# Patient Record
Sex: Male | Born: 1975
Health system: Southern US, Community
[De-identification: ages and names within clinical notes are randomized; demographics above are authoritative.]

## PROBLEM LIST (undated history)

## (undated) DIAGNOSIS — G40909 Epilepsy, unspecified, not intractable, without status epilepticus: Secondary | ICD-10-CM

## (undated) DIAGNOSIS — F419 Anxiety disorder, unspecified: Secondary | ICD-10-CM

## (undated) DIAGNOSIS — R4189 Other symptoms and signs involving cognitive functions and awareness: Secondary | ICD-10-CM

## (undated) DIAGNOSIS — F22 Delusional disorders: Secondary | ICD-10-CM

## (undated) DIAGNOSIS — K219 Gastro-esophageal reflux disease without esophagitis: Secondary | ICD-10-CM

## (undated) DIAGNOSIS — I10 Essential (primary) hypertension: Secondary | ICD-10-CM

## (undated) DIAGNOSIS — N3281 Overactive bladder: Secondary | ICD-10-CM

## (undated) DIAGNOSIS — F209 Schizophrenia, unspecified: Secondary | ICD-10-CM

## (undated) DIAGNOSIS — R739 Hyperglycemia, unspecified: Secondary | ICD-10-CM

---

## 2006-09-26 ENCOUNTER — Emergency Department (HOSPITAL_COMMUNITY): Admission: EM | Admit: 2006-09-26 | Discharge: 2006-09-26 | Payer: Self-pay | Admitting: Emergency Medicine

## 2010-11-05 LAB — RAPID URINE DRUG SCREEN, HOSP PERFORMED
Amphetamines: NOT DETECTED
Barbiturates: NOT DETECTED
Benzodiazepines: NOT DETECTED
Cocaine: NOT DETECTED

## 2010-11-05 LAB — POCT URINALYSIS DIP (DEVICE)
Glucose, UA: NEGATIVE
Nitrite: POSITIVE — AB
Protein, ur: >=300 — AB
Specific Gravity, Urine: >=1.03
Urobilinogen, UA: 2 — ABNORMAL HIGH
pH: 6

## 2010-11-05 LAB — VALPROIC ACID LEVEL: Valproic Acid Lvl: 73.1

## 2010-11-05 LAB — CBC
HCT: 36.5 — ABNORMAL LOW
Hemoglobin: 12.3 — ABNORMAL LOW
MCHC: 33.6
MCV: 93.9
RBC: 3.89 — ABNORMAL LOW

## 2010-11-05 LAB — URINE CULTURE

## 2010-11-05 LAB — DIFFERENTIAL
Basophils Absolute: 0
Eosinophils Absolute: 0.2
Lymphocytes Relative: 47 — ABNORMAL HIGH
Lymphs Abs: 4.3 — ABNORMAL HIGH
Neutrophils Relative %: 43

## 2010-11-05 LAB — URINALYSIS, ROUTINE W REFLEX MICROSCOPIC
Bilirubin Urine: NEGATIVE
Nitrite: NEGATIVE
Protein, ur: 30 — AB
Specific Gravity, Urine: 1.023
Urobilinogen, UA: 0.2

## 2010-11-05 LAB — COMPREHENSIVE METABOLIC PANEL
BUN: 15
CO2: 21
Calcium: 8.6
Creatinine, Ser: 1.4
GFR calc non Af Amer: 60 — ABNORMAL LOW
Glucose, Bld: 153 — ABNORMAL HIGH

## 2010-11-05 LAB — MAGNESIUM: Magnesium: 2.3

## 2010-11-05 LAB — URINE MICROSCOPIC-ADD ON

## 2011-10-30 ENCOUNTER — Emergency Department (INDEPENDENT_AMBULATORY_CARE_PROVIDER_SITE_OTHER)
Admission: EM | Admit: 2011-10-30 | Discharge: 2011-10-30 | Disposition: A | Discharge status: Home / Self Care | Payer: Medicare Other | Source: Home / Self Care | Attending: Emergency Medicine | Admitting: Emergency Medicine

## 2011-10-30 ENCOUNTER — Encounter (HOSPITAL_COMMUNITY): Payer: Self-pay | Admitting: Emergency Medicine

## 2011-10-30 DIAGNOSIS — S01511A Laceration without foreign body of lip, initial encounter: Secondary | ICD-10-CM

## 2011-10-30 DIAGNOSIS — Z23 Encounter for immunization: Secondary | ICD-10-CM

## 2011-10-30 DIAGNOSIS — S01501A Unspecified open wound of lip, initial encounter: Secondary | ICD-10-CM

## 2011-10-30 HISTORY — DX: Delusional disorders: F22

## 2011-10-30 MED ORDER — TETANUS-DIPHTH-ACELL PERTUSSIS 5-2.5-18.5 LF-MCG/0.5 IM SUSP
INTRAMUSCULAR | Status: AC
  Filled 2011-10-30: qty 0.5

## 2011-10-30 MED ORDER — MUPIROCIN 2 % EX OINT: TOPICAL_OINTMENT | Freq: Three times a day (TID) | CUTANEOUS | Status: DC

## 2011-10-30 MED ORDER — TETANUS-DIPHTH-ACELL PERTUSSIS 5-2.5-18.5 LF-MCG/0.5 IM SUSP
0.5000 mL | Freq: Once | INTRAMUSCULAR | Status: AC
  Administered 2011-10-30: 0.5 mL via INTRAMUSCULAR
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
## 2012-07-24 ENCOUNTER — Emergency Department (HOSPITAL_COMMUNITY): Payer: Medicare Other

## 2012-07-24 ENCOUNTER — Emergency Department (HOSPITAL_COMMUNITY)
Admission: EM | Admit: 2012-07-24 | Discharge: 2012-07-25 | Disposition: A | Discharge status: Home / Self Care | Payer: Medicare Other | Attending: Emergency Medicine | Admitting: Emergency Medicine

## 2012-07-24 ENCOUNTER — Encounter (HOSPITAL_COMMUNITY): Payer: Self-pay | Admitting: Emergency Medicine

## 2012-07-24 DIAGNOSIS — R5381 Other malaise: Secondary | ICD-10-CM | POA: Insufficient documentation

## 2012-07-24 DIAGNOSIS — Z79899 Other long term (current) drug therapy: Secondary | ICD-10-CM | POA: Insufficient documentation

## 2012-07-24 DIAGNOSIS — R4182 Altered mental status, unspecified: Secondary | ICD-10-CM

## 2012-07-24 DIAGNOSIS — R112 Nausea with vomiting, unspecified: Secondary | ICD-10-CM | POA: Insufficient documentation

## 2012-07-24 DIAGNOSIS — R42 Dizziness and giddiness: Secondary | ICD-10-CM | POA: Insufficient documentation

## 2012-07-24 DIAGNOSIS — F22 Delusional disorders: Secondary | ICD-10-CM | POA: Insufficient documentation

## 2012-07-24 DIAGNOSIS — R443 Hallucinations, unspecified: Secondary | ICD-10-CM | POA: Insufficient documentation

## 2012-07-24 LAB — CBC WITH DIFFERENTIAL/PLATELET
Basophils Absolute: 0 10*3/uL (ref 0.0–0.1)
Eosinophils Relative: 1 % (ref 0–5)
HCT: 39.3 % (ref 39.0–52.0)
Hemoglobin: 13.9 g/dL (ref 13.0–17.0)
Lymphocytes Relative: 24 % (ref 12–46)
Lymphs Abs: 1.8 10*3/uL (ref 0.7–4.0)
MCV: 91.4 fL (ref 78.0–100.0)
Monocytes Absolute: 0.6 10*3/uL (ref 0.1–1.0)
Monocytes Relative: 8 % (ref 3–12)
Neutro Abs: 5 10*3/uL (ref 1.7–7.7)
RBC: 4.3 MIL/uL (ref 4.22–5.81)
WBC: 7.4 10*3/uL (ref 4.0–10.5)

## 2012-07-24 LAB — POCT I-STAT, CHEM 8
Chloride: 102 mEq/L (ref 96–112)
Creatinine, Ser: 1.1 mg/dL (ref 0.50–1.35)
HCT: 43 % (ref 39.0–52.0)
Hemoglobin: 14.6 g/dL (ref 13.0–17.0)
Potassium: 4 mEq/L (ref 3.5–5.1)
Sodium: 143 mEq/L (ref 135–145)

## 2012-07-24 LAB — URINALYSIS, ROUTINE W REFLEX MICROSCOPIC
Bilirubin Urine: NEGATIVE
Glucose, UA: NEGATIVE mg/dL
Ketones, ur: 15 mg/dL — AB
Nitrite: NEGATIVE
Specific Gravity, Urine: 1.007 (ref 1.005–1.030)
pH: 6.5 (ref 5.0–8.0)

## 2012-07-24 LAB — ACETAMINOPHEN LEVEL: Acetaminophen (Tylenol), Serum: <15 ug/mL (ref 10–30)

## 2012-07-24 LAB — RAPID URINE DRUG SCREEN, HOSP PERFORMED
Amphetamines: NOT DETECTED
Barbiturates: NOT DETECTED
Benzodiazepines: NOT DETECTED
Cocaine: NOT DETECTED

## 2012-07-24 LAB — VALPROIC ACID LEVEL: Valproic Acid Lvl: 64.1 ug/mL (ref 50.0–100.0)

## 2012-07-24 MED ORDER — DIVALPROEX SODIUM ER 500 MG PO TB24
750.0000 mg | ORAL_TABLET | Freq: Every day | ORAL | Status: DC
  Administered 2012-07-24: 750 mg via ORAL
  Filled 2012-07-24 (×2): qty 1

## 2012-07-24 MED ORDER — BENZTROPINE MESYLATE 1 MG PO TABS
1.0000 mg | ORAL_TABLET | Freq: Two times a day (BID) | ORAL | Status: DC
  Administered 2012-07-24 – 2012-07-25 (×2): 1 mg via ORAL
  Filled 2012-07-24 (×2): qty 1

## 2012-07-24 MED ORDER — PANTOPRAZOLE SODIUM 40 MG PO TBEC
80.0000 mg | DELAYED_RELEASE_TABLET | Freq: Every day | ORAL | Status: DC
  Administered 2012-07-24 – 2012-07-25 (×2): 80 mg via ORAL
  Filled 2012-07-24 (×4): qty 1

## 2012-07-24 MED ORDER — OXYBUTYNIN CHLORIDE 5 MG PO TABS
5.0000 mg | ORAL_TABLET | Freq: Three times a day (TID) | ORAL | Status: DC
  Administered 2012-07-24 – 2012-07-25 (×2): 5 mg via ORAL
  Filled 2012-07-24 (×5): qty 1

## 2012-07-24 MED ORDER — ADULT MULTIVITAMIN W/MINERALS CH
1.0000 | ORAL_TABLET | Freq: Once | ORAL | Status: AC
  Administered 2012-07-24: 1 via ORAL
  Filled 2012-07-24: qty 1

## 2012-07-24 MED ORDER — CLOZAPINE 100 MG PO TABS
100.0000 mg | ORAL_TABLET | Freq: Two times a day (BID) | ORAL | Status: DC
  Administered 2012-07-24 – 2012-07-25 (×2): 100 mg via ORAL
  Filled 2012-07-24 (×4): qty 1

## 2012-07-24 MED ORDER — LURASIDONE HCL 80 MG PO TABS
80.0000 mg | ORAL_TABLET | Freq: Every day | ORAL | Status: DC
  Administered 2012-07-24: 80 mg via ORAL
  Filled 2012-07-24 (×2): qty 1

## 2012-07-24 MED ORDER — CLOZAPINE 100 MG PO TABS: 100.0000 mg | ORAL_TABLET | Freq: Every day | ORAL | Status: DC

## 2012-07-24 MED ORDER — LURASIDONE HCL 80 MG PO TABS
80.0000 mg | ORAL_TABLET | Freq: Every day | ORAL | Status: DC
  Filled 2012-07-24: qty 1

## 2012-07-24 MED ORDER — LORAZEPAM 1 MG PO TABS
0.5000 mg | ORAL_TABLET | Freq: Two times a day (BID) | ORAL | Status: DC
  Administered 2012-07-24 – 2012-07-25 (×2): 0.5 mg via ORAL
  Filled 2012-07-24 (×2): qty 1

## 2012-07-24 MED ORDER — DOCUSATE SODIUM 100 MG PO CAPS
100.0000 mg | ORAL_CAPSULE | Freq: Two times a day (BID) | ORAL | Status: DC
  Administered 2012-07-24 – 2012-07-25 (×2): 100 mg via ORAL
  Filled 2012-07-24 (×3): qty 1

## 2012-07-24 MED ORDER — ACETAMINOPHEN 325 MG PO TABS: 650.0000 mg | ORAL_TABLET | ORAL | Status: DC | PRN

## 2012-07-24 NOTE — ED Notes (Signed)
Per EMS: pt from group home; pt states he was sick he could not talk in bathroom; pt has hx of psych disorders; pinpoint pupils and drooling; airway intact; pt follows commands; 0.4 Narcan given without relief. No hx of seizures. Pt more agitated than usual. RR 16 pulse 84 BP 108/74 99% CBG 103.

## 2012-07-24 NOTE — BH Assessment (Signed)
Assessment Note   Blake Long is an 37 y.o. male.  Pt had nausea, dizziness, vomiting, weakness while at the group home today.  He has also had an increase in his hearing voices.  Patient was brought in by EMS.  Patient was accompanied by his mother and gave permission for her to be present during assessment.  Patient reports that he has had some psychiatric medication changes over the last few weeks.  Patient has also had a lab from his primary care doctor (Blake Long in [facility]) that shows abnormal thyroid function.  Patient complains that he is hearing voices more clearly than usual.  At baseline, patient can ignore voices.  Over the last 2-3 weeks they have gotten louder and he describes them as putting him down or saying insulting things.  Patient also hears them sometimes over the radio or on television.  Pt denies any SI or HI at this time.  No visual hallucinations.   This clinician spoke to Blake Long, who operates the group home in which patient resides.  He said that the doctor at [facility] had made some changes to medications about 2-3 weeks ago.  There had been a change also in depakote on 06/24 from 500mg  to 250mg  due to valproic acid level.  Patient has been reporting lethargy in the morning and a dread of getting up because he knows the voices are getting louder.  Blake Long says that patient has shown some increased irritability over the last few weeks.  He had yelled at the director of the day program [facility]) last Thursday and has taken a break from there since then.  Patient is able to go to [facility] tomorrow as an emergency walk-in at 14:00 according to Blake Long.  Blake Long is very interested in seeing if the thyroid is what is behind some of the symptoms that patient has been having.  Patient has exhibited poor recall of recent events, also poor appetite over the last few days.  Blake Long is concerned about patient having another fainting episode if  returned home before the lab comes back for TSH function.  He will bring him back home but is concerned about his safety. This clinician did talk to Blake Long about patient psychiatric symptoms.  He agreed that patient can be monitored on outpatient basis for his current increase in baseline symptoms.  Blake Long agreed that inpatient psychiatric care was not necessary at this point.   [facility] [phone].  Blake Long cell [phone]. Axis I: Schizoaffective Disorder Axis II: Deferred Axis III:  Past Medical History  Diagnosis Date  . Paranoid disorder    Axis IV: occupational problems and other psychosocial or environmental problems Axis V: 41-50 serious symptoms  Past Medical History:  Past Medical History  Diagnosis Date  . Paranoid disorder     History reviewed. No pertinent past surgical history.  Family History: History reviewed. No pertinent family history.  Social History:  reports that he has never smoked. He does not have any smokeless tobacco history on file. He reports that he does not drink alcohol. His drug history is not on file.  Additional Social History:  Alcohol / Drug Use Pain Medications: See PTA medication list Prescriptions: See PTA medication list.  Recent change to depakote on 06/24. Over the Counter: N/A History of alcohol / drug use?: No history of alcohol / drug abuse  CIWA: CIWA-Ar BP: 117/82 mmHg Pulse Rate: 95 COWS:    Allergies: No Known  Allergies  Home Medications:  (Not in a hospital admission)  OB/GYN Status:  No LMP for male patient.  General Assessment Data Location of Assessment: [facility] ED Living Arrangements: Other (Comment) (Group home, [facility]") Can pt return to current living arrangement?: Yes Admission Status: Voluntary Is patient capable of signing voluntary admission?: Yes Transfer from: Group Home Referral Source: Other (Group home staff)     Risk to self Suicidal Ideation: No Suicidal Intent:  No Is patient at risk for suicide?: No Suicidal Plan?: No Access to Means: No What has been your use of drugs/alcohol within the last 12 months?: None Previous Attempts/Gestures: No How many times?: 0 Other Self Harm Risks: None Triggers for Past Attempts: None known Intentional Self Injurious Behavior: None Family Suicide History: No Recent stressful life event(s): Other (Comment) (Medication change on 06/24) Persecutory voices/beliefs?: Yes Depression: Yes Depression Symptoms: Feeling worthless/self pity Substance abuse history and/or treatment for substance abuse?: No Suicide prevention information given to non-admitted patients: Not applicable  Risk to Others Homicidal Ideation: No Thoughts of Harm to Others: No Current Homicidal Intent: No Current Homicidal Plan: No Access to Homicidal Means: No Identified Victim: No one History of harm to others?: No Assessment of Violence: None Noted Violent Behavior Description: Pt calm and cooperative Does patient have access to weapons?: No Criminal Charges Pending?: No Does patient have a court date: No  Psychosis Hallucinations: Auditory (Voices tell him insulting things) Delusions: None noted  Mental Status Report Appear/Hygiene:  (Casual) Eye Contact: Good Motor Activity: Freedom of movement;Unremarkable Speech: Logical/coherent Level of Consciousness: Alert Mood: Depressed;Anxious Affect: Anxious;Depressed Anxiety Level: Moderate Thought Processes: Coherent;Relevant Judgement: Unimpaired Orientation: Person;Place;Time;Situation;Appropriate for developmental age (Not oriented to date) Obsessive Compulsive Thoughts/Behaviors: Minimal  Cognitive Functioning Concentration: Decreased Memory: Recent Impaired;Remote Intact IQ: Average Insight: Fair Impulse Control: Good Appetite: Fair Weight Loss:  (Poor appetite over last 2-3 weeks) Weight Gain: 0 Sleep: No Change Total Hours of Sleep: 8 Vegetative Symptoms:  Staying in bed (Staff encourage him to get involved.)  ADLScreening [facility] Assessment Services) Patient's cognitive ability adequate to safely complete daily activities?: Yes Patient able to express need for assistance with ADLs?: Yes Independently performs ADLs?: Yes (appropriate for developmental age)  Abuse/Neglect [facility]) Physical Abuse: Denies Verbal Abuse: Denies Sexual Abuse: Denies  Prior Inpatient Therapy Prior Inpatient Therapy: Yes Prior Therapy Dates: 2000 Prior Therapy Facilty/Provider(s): [facility] Reason for Treatment: psychosis  Prior Outpatient Therapy Prior Outpatient Therapy: Yes Prior Therapy Dates: Over the last 14 years Prior Therapy Facilty/Provider(s): [facility] and [facility] Reason for Treatment: medication management  ADL Screening (condition at time of admission) Patient's cognitive ability adequate to safely complete daily activities?: Yes Patient able to express need for assistance with ADLs?: Yes Independently performs ADLs?: Yes (appropriate for developmental age) Weakness of Legs: None Weakness of Arms/Hands: None  Home Assistive Devices/Equipment Home Assistive Devices/Equipment: None    Abuse/Neglect Assessment (Assessment to be complete while patient is alone) Physical Abuse: Denies Verbal Abuse: Denies Sexual Abuse: Denies Exploitation of patient/patient's resources: Denies Self-Neglect: Denies Values / Beliefs Cultural Requests During Hospitalization: None Spiritual Requests During Hospitalization: None   Advance Directives (For Healthcare) Advance Directive: Patient does not have advance directive;Patient would not like information    Additional Information 1:1 In Past 12 Months?: No CIRT Risk: No Elopement Risk: No Does patient have medical clearance?: Yes     Disposition:  Disposition Initial Assessment Completed for this Encounter: Yes Disposition of Patient: Outpatient treatment Type of outpatient treatment: Adult (Current  provider,  [facility])  On Site Evaluation by:   Reviewed with Physician:  Dr. Helyn App, Berna Spare Ray 07/24/2012 10:50 PM
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
## 2012-07-25 NOTE — ED Notes (Signed)
Representative from the Group Home, Blake Long came and picked up pt. For discharge.  Group Home will notify pt.s mother.

## 2012-07-25 NOTE — ED Notes (Signed)
Pt. Is oob to the phone to talk with his mom. Pt. Has requested to take a shower after his mom has arrived

## 2012-07-25 NOTE — ED Provider Notes (Signed)
Patient accepted at his group home.   Blake Long. Rubin Payor, MD 07/25/12 [phone]

## 2014-02-06 DIAGNOSIS — Z79899 Other long term (current) drug therapy: Secondary | ICD-10-CM | POA: Diagnosis not present

## 2014-02-10 DIAGNOSIS — Z0000 Encounter for general adult medical examination without abnormal findings: Secondary | ICD-10-CM | POA: Diagnosis not present

## 2014-03-12 DIAGNOSIS — F209 Schizophrenia, unspecified: Secondary | ICD-10-CM | POA: Diagnosis not present

## 2014-03-19 DIAGNOSIS — Z79899 Other long term (current) drug therapy: Secondary | ICD-10-CM | POA: Diagnosis not present

## 2014-04-02 DIAGNOSIS — F419 Anxiety disorder, unspecified: Secondary | ICD-10-CM | POA: Diagnosis not present

## 2014-04-15 DIAGNOSIS — F209 Schizophrenia, unspecified: Secondary | ICD-10-CM | POA: Diagnosis not present

## 2014-04-15 DIAGNOSIS — Z79899 Other long term (current) drug therapy: Secondary | ICD-10-CM | POA: Diagnosis not present

## 2014-05-12 DIAGNOSIS — F209 Schizophrenia, unspecified: Secondary | ICD-10-CM | POA: Diagnosis not present

## 2014-05-15 DIAGNOSIS — Z79899 Other long term (current) drug therapy: Secondary | ICD-10-CM | POA: Diagnosis not present

## 2014-05-21 ENCOUNTER — Emergency Department (HOSPITAL_COMMUNITY)
Admission: EM | Admit: 2014-05-21 | Discharge: 2014-05-21 | Disposition: A | Discharge status: Home / Self Care | Payer: Medicare Other | Attending: Emergency Medicine | Admitting: Emergency Medicine

## 2014-05-21 ENCOUNTER — Encounter (HOSPITAL_COMMUNITY): Payer: Self-pay | Admitting: *Deleted

## 2014-05-21 DIAGNOSIS — R4182 Altered mental status, unspecified: Secondary | ICD-10-CM | POA: Diagnosis present

## 2014-05-21 DIAGNOSIS — F99 Mental disorder, not otherwise specified: Secondary | ICD-10-CM | POA: Diagnosis not present

## 2014-05-21 DIAGNOSIS — R55 Syncope and collapse: Secondary | ICD-10-CM | POA: Insufficient documentation

## 2014-05-21 DIAGNOSIS — Z79899 Other long term (current) drug therapy: Secondary | ICD-10-CM | POA: Insufficient documentation

## 2014-05-21 DIAGNOSIS — F209 Schizophrenia, unspecified: Secondary | ICD-10-CM | POA: Diagnosis not present

## 2014-05-21 DIAGNOSIS — F29 Unspecified psychosis not due to a substance or known physiological condition: Secondary | ICD-10-CM | POA: Diagnosis not present

## 2014-05-21 LAB — I-STAT CHEM 8, ED
BUN: 19 mg/dL (ref 6–23)
Calcium, Ion: 1.25 mmol/L — ABNORMAL HIGH (ref 1.12–1.23)
Chloride: 102 mmol/L (ref 96–112)
Creatinine, Ser: 1.3 mg/dL (ref 0.50–1.35)
Glucose, Bld: 99 mg/dL (ref 70–99)
HCT: 44 % (ref 39.0–52.0)
HEMOGLOBIN: 15 g/dL (ref 13.0–17.0)
Potassium: 4.4 mmol/L (ref 3.5–5.1)
SODIUM: 140 mmol/L (ref 135–145)
TCO2: 22 mmol/L (ref 0–100)

## 2014-05-21 LAB — CBC
HCT: 40.2 % (ref 39.0–52.0)
HEMOGLOBIN: 13.7 g/dL (ref 13.0–17.0)
MCH: 31.3 pg (ref 26.0–34.0)
MCHC: 34.1 g/dL (ref 30.0–36.0)
MCV: 91.8 fL (ref 78.0–100.0)
Platelets: 183 10*3/uL (ref 150–400)
RBC: 4.38 MIL/uL (ref 4.22–5.81)
RDW: 13.6 % (ref 11.5–15.5)
WBC: 9.2 10*3/uL (ref 4.0–10.5)

## 2014-05-21 LAB — RAPID URINE DRUG SCREEN, HOSP PERFORMED
Amphetamines: NOT DETECTED
Barbiturates: NOT DETECTED
Benzodiazepines: NOT DETECTED
COCAINE: NOT DETECTED
OPIATES: NOT DETECTED
Tetrahydrocannabinol: NOT DETECTED

## 2014-05-21 LAB — ETHANOL: Alcohol, Ethyl (B): <5 mg/dL (ref 0–9)

## 2014-05-21 LAB — ACETAMINOPHEN LEVEL: Acetaminophen (Tylenol), Serum: <10 ug/mL — ABNORMAL LOW (ref 10–30)

## 2014-05-21 MED ORDER — SODIUM CHLORIDE 0.9 % IV BOLUS (SEPSIS)
1000.0000 mL | Freq: Once | INTRAVENOUS | Status: AC
  Administered 2014-05-21: 1000 mL via INTRAVENOUS

## 2014-05-21 NOTE — BH Assessment (Signed)
Assessment Note  Blake Long is an 39 y.o. male presents to [facility] via EMS. Per EMS report: "Pt is a resident of a group home and goes to adult day care daily." "Today when he arrived to day care staff reported pt was agitated, yelling at staff, he threw himself on the floor and would not follow commands so they called an ambulance."  Writer met with patient who has no recollection of the events reported by EMS "agitated, yelling at staff, he threw himself on the floor". Sts that he does recall feeling sick earlier today. He was at a day program when the incident occurred. Sts that this has not happened before. Patient denies SI and HI. He has not history of SI and HI. No history of violence. No legal issues.  He has a dx's of paranoid schizophrenia. Sts that his baseline is to hear voices but he is able to determine reality. Patient admits that he sometimes feels paranoid when he is outside his familiar environment. Patient receives out patient mental health services at [facility]. He is compliant with his medication regimen. He has received inpatient hospitalization at [facility] 3x's in the past. Last admission was 2000 for psychotic related symptoms. Patient sts, "I felt sick earlier but now I feel much better". Patient asking to return back to the group home.   Writer contacted the group home [phone]. The group home owner [facility]) was not available. Writer spoke to group home staff "Awanda Mink". He concurred with the EMS report. Sts that patient was ok this morning as he ate breakfast and went to the day program as normal. Sts patient has not history of similar episodes. No safety concerns reported (no SI or HI). Sts that patient is sometimes paranoid as this is his baseline. No further concerns noted. Patient is able to return back to the group home, per [place]. Group home willing to pick patient up from ED once cleared. Sts, "We are more than willing to have patient back at the group home".    Writer discussed clinicals with Waylan Boga, DNP and she agrees that patient does not meet criteria for admission. Patient psychiatrically cleared. Writer also discussed the above clinicals with PA-C Al Corpus. She is agreeable to discharge patient once he is medically cleared.   Axis I: Paranoid Schizophrenia Axis II: Deferred Axis III:  Past Medical History  Diagnosis Date  . Paranoid disorder    Axis IV: other psychosocial or environmental problems, problems related to social environment and problems with primary support group Axis V: 51-60 moderate symptoms  Past Medical History:  Past Medical History  Diagnosis Date  . Paranoid disorder     History reviewed. No pertinent past surgical history.  Family History: History reviewed. No pertinent family history.  Social History:  reports that he has never smoked. He does not have any smokeless tobacco history on file. He reports that he does not drink alcohol. His drug history is not on file.  Additional Social History:  Alcohol / Drug Use Pain Medications: SEE MAR Prescriptions: SEE MAR Over the Counter: SEE MAR History of alcohol / drug use?: No history of alcohol / drug abuse  CIWA: CIWA-Ar BP: 137/90 mmHg Pulse Rate: 99 COWS:    Allergies: No Known Allergies  Home Medications:  (Not in a hospital admission)  OB/GYN Status:  No LMP for male patient.  General Assessment Data Location of Assessment: [facility] ED Is this a Tele or Face-to-Face Assessment?: Face-to-Face Is this an Initial Assessment or  a Re-assessment for this encounter?: Initial Assessment Living Arrangements: Other (Comment) (Group Home # [phone]- [facility]) Can pt return to current living arrangement?: Yes Admission Status: Voluntary Is patient capable of signing voluntary admission?: Yes Transfer from: [facility] Referral Source: Self/Family/Friend     [place] Living Arrangements: Other (Comment)  (Group Home # [phone]- [facility]) Name of Psychiatrist:  Beverly Sessions) Name of Therapist:  (n/a)  Education Status Is patient currently in school?: No  Risk to self with the past 6 months Suicidal Ideation: No Suicidal Intent: No Is patient at risk for suicide?: No Suicidal Plan?: No Access to Means: No What has been your use of drugs/alcohol within the last 12 months?:  (n/a) Previous Attempts/Gestures: No How many times?:  (n/a) Other Self Harm Risks:  (n/a) Triggers for Past Attempts: Other (Comment) (patient denies past attempts or triggers) Intentional Self Injurious Behavior: None Family Suicide History: No Recent stressful life event(s): Other (Comment) (patient denies ) Persecutory voices/beliefs?: No Depression: Yes Depression Symptoms:  (no symptoms reported ) Substance abuse history and/or treatment for substance abuse?: No Suicide prevention information given to non-admitted patients: Not applicable  Risk to Others within the past 6 months Homicidal Ideation: No Thoughts of Harm to Others: No Current Homicidal Intent: No Current Homicidal Plan: No Access to Homicidal Means: No Identified Victim:  (n/a) History of harm to others?: No Assessment of Violence: None Noted Violent Behavior Description:  (patient is calm and cooperative ) Does patient have access to weapons?: No Criminal Charges Pending?: No Does patient have a court date: No  Psychosis Hallucinations: Auditory Garment/textile technologist various voices that pick on him) Delusions: Unspecified (auditory-patient sts he is able to discern reality vs. non r)  Mental Status Report Appearance/Hygiene: Disheveled Eye Contact: Good Motor Activity: Freedom of movement Speech: Logical/coherent Level of Consciousness: Alert Mood: Depressed Affect: Appropriate to circumstance Anxiety Level: Severe Thought Processes: Relevant Judgement: Unimpaired Orientation: Person, Place, Situation,  Time Obsessive Compulsive Thoughts/Behaviors: None  Cognitive Functioning Concentration: Decreased Memory: Recent Intact, Remote Intact IQ: Average Insight: Fair Impulse Control: Good Appetite: Poor Weight Loss:  (patient reports weight loss over the years of 30 pounds ) Weight Gain:  (none reported ) Sleep: Decreased Total Hours of Sleep:  (varies- 8 or more hours ) Vegetative Symptoms: None  ADLScreening [facility] Assessment Services) Patient's cognitive ability adequate to safely complete daily activities?: Yes Patient able to express need for assistance with ADLs?: Yes Independently performs ADLs?: No  Prior Inpatient Therapy Prior Inpatient Therapy: Yes Prior Therapy Dates:  (3x's at [facility]- last admission was 2000) Prior Therapy Facilty/Provider(s):  [facility] ) Reason for Treatment:  (schizophrenia, psychosis, med managment )  Prior Outpatient Therapy Prior Outpatient Therapy: Yes Prior Therapy Dates:  (current ) Prior Therapy Facilty/Provider(s):  Consulting civil engineer ) Reason for Treatment:  (med managment )  ADL Screening (condition at time of admission) Patient's cognitive ability adequate to safely complete daily activities?: Yes Is the patient deaf or have difficulty hearing?: No Does the patient have difficulty seeing, even when wearing glasses/contacts?: No Patient able to express need for assistance with ADLs?: Yes Does the patient have difficulty dressing or bathing?: No Independently performs ADLs?: No Communication: Independent Dressing (OT): Independent Grooming: Independent Feeding: Independent Bathing: Independent Toileting: Independent In/Out Bed: Independent Walks in Home: Independent Does the patient have difficulty walking or climbing stairs?: No Weakness of Legs: None Weakness of Arms/Hands: None  Home Assistive Devices/Equipment Home Assistive Devices/Equipment: None    Abuse/Neglect Assessment (Assessment to  be complete while patient is  alone) Physical Abuse: Denies Verbal Abuse: Denies Sexual Abuse: Denies Exploitation of patient/patient's resources: Denies Self-Neglect: Denies Values / Beliefs Cultural Requests During Hospitalization: None Spiritual Requests During Hospitalization: None   Advance Directives (For Healthcare) Does patient have an advance directive?: No Would patient like information on creating an advanced directive?: No - patient declined information    Additional Information 1:1 In Past 12 Months?: No CIRT Risk: No Elopement Risk: No Does patient have medical clearance?: Yes     Disposition:  Disposition Initial Assessment Completed for this Encounter: Yes Disposition of Patient: Referred to, Other dispositions Waylan Boga, NP recommends discharge home pending med clear) Other disposition(s): Other (Comment) (patient to return to group home once [place])    On Site Evaluation by:   Reviewed with Physician:    Waldon Merl [facility] 05/21/2014 1:47 PM

## 2014-05-21 NOTE — Discharge Instructions (Signed)
Return to the emergency room with worsening of symptoms, new symptoms or with symptoms that are concerning , especially severe worsening of headache, visual or speech changes, weakness in face, arms or legs. Please call your doctor for a followup appointment within 24-48 hours. When you talk to your doctor please let them know that you were seen in the emergency department and have them acquire all of your records so that they can discuss the findings with you and formulate a treatment plan to fully care for your new and ongoing problems. If you do not have a primary care provider please call the number below under ED resources to establish care with a provider and follow up.  Read below information and follow recommendations.  Emergency Department Resource Guide 1) Find a Doctor and Pay Out of Pocket Although you won't have to find out who is covered by your insurance plan, it is a good idea to ask around and get recommendations. You will then need to call the office and see if the doctor you have chosen will accept you as a new patient and what types of options they offer for patients who are self-pay. Some doctors offer discounts or will set up payment plans for their patients who do not have insurance, but you will need to ask so you aren't surprised when you get to your appointment.  2) Contact Your Local Health Department Not all health departments have doctors that can see patients for sick visits, but many do, so it is worth a call to see if yours does. If you don't know where your local health department is, you can check in your phone book. The CDC also has a tool to help you locate your state's health department, and many state websites also have listings of all of their local health departments.  3) Find a Walk-in Clinic If your illness is not likely to be very severe or complicated, you may want to try a walk in clinic. These are popping up all over the country in pharmacies, drugstores, and  shopping centers. They're usually staffed by nurse practitioners or physician assistants that have been trained to treat common illnesses and complaints. They're usually fairly quick and inexpensive. However, if you have serious medical issues or chronic medical problems, these are probably not your best option.  No Primary Care Doctor: - Call Health Connect at  [phone] - they can help you locate a primary care doctor that  accepts your insurance, provides certain services, etc. - Physician Referral Service- [phone]  Chronic Pain Problems: Organization         Address  Phone   Notes  [facility]  [phone] Patients need to be referred by their primary care doctor.   Medication Assistance: Organization         Address  Phone   Notes  [facility] Medication [facility] [address] [place] [place], [place] [address] [phone] --Must be a resident of [facility] -- Must have NO insurance coverage whatsoever (no Medicaid/ Medicare, etc.) -- The pt. MUST have a primary care doctor that directs their care regularly and follows them in the community   MedAssist  [phone]   [employer]  [phone]    Agencies that provide inexpensive medical care: Organization         Address  Phone   Notes  [facility]  [phone]   [facility]    (  [phone]   [facility] [place], [place] [address] [phone]   [place] [address] [place] [phone]   Planned Parenthood    [phone]   [facility]    [phone]   [place] and [place] [address], [place] Phone:  [phone], Fax:  [phone] Hours of Operation:  9 am - 6 pm, M-F.  Also accepts Medicaid/Medicare and self-pay.  [facility] for [address] Humphre[address], [address], [place] Phone: [phone],  Fax: [phone]. Hours of Operation:  8:30 am - 5:30 pm, M-F.  Also accepts Medicaid and self-pay.  [facility] [place] [address] [place] Phone: [phone]   [place], [place], [place] [phone], Ext. [phone] Mondays & Thursdays: 7-9 AM.  First 15 patients are seen on a first come, first serve basis.    [place] Providers:  Organization         Address  Phone   Notes  [facility] [address], [place] [phone] Also accepts self-pay patients.  [facility] [id number] [place], [place]  [phone]   [place], [address], [place] [phone]   [facility] [address] [place] [phone]   Lucianne Lei [address], [place]   [phone] Only accepts [place] Access [place] patients after they have their name applied to their card.   Self-Pay (no insurance) in [facility]:  Organization         Address  Phone   Notes  Sickle Cell Patients, [facility] [place] [phone]   [facility] [place] [phone]   [facility] [place]  [place], [place], [place] [phone]   [facility]/Dr. Osei-Bonsu  [address] [place] or [address], [place] [phone] Phone number for both [place] and [place] locations is the same.  [facility] [address], [place] [phone]   [facility] [address] [place] or [address] [phone]   [facility] [address] [place] [phone], phone; [phone], fax Sees patients 1st and 3rd Saturday of every month.  Must not qualify for public or private  insurance (i.e. Medicaid, Medicare, [place] Health Choice, Veterans' Benefits)  Household income should be no more than 200% of the poverty level The clinic cannot treat you if you are pregnant or think you are pregnant  Sexually transmitted diseases are not treated at the clinic.    Dental Care: Organization         Address  Phone  Notes  [facility] [place] [phone] Accepts children up to age 68 who are enrolled in [place] or [place]; pregnant women with a Medicaid card; and children who have applied for Medicaid or [place] Health Choice, but were declined, whose parents can pay a reduced fee at time of service.  [facility] Department of [facility]  [address], [place] [phone] Accepts children up  to age 23 who are enrolled in Medicaid or [place] Health Choice; pregnant women with a Medicaid card; and children who have applied for Medicaid or [place] Health Choice, but were declined, whose parents can pay a reduced fee at time of service.  [place] Adult Dental Access PROGRAM  [place] [phone] Patients are seen by appointment only. Walk-ins are not accepted. [place] will see patients 26 years of age and older. Monday - Tuesday (8am-5pm) Most Wednesdays (8:30-5pm) $30 per visit, cash only  [facility] Adult Dental Access PROGRAM  [address], [facility] [phone] Patients are seen by appointment only. Walk-ins are not accepted. [place] will see patients 6 years of age and older. One Wednesday Evening (Monthly: Volunteer Based).  $30 per visit, cash only  [place]  [phone] for adults; Children under age 34, call Graduate Pediatric Dentistry at [phone]. Children aged 51-14, please call [phone] to request a pediatric application.  Dental services are provided in all areas of dental care  including fillings, crowns and bridges, complete and partial dentures, implants, gum treatment, root canals, and extractions. Preventive care is also provided. Treatment is provided to both adults and children. Patients are selected via a lottery and there is often a waiting list.   [facility] [address], [place]  [phone] [email or web address]   [facility] [address] [place], [place] [phone], Ext. [phone] Second and Fourth Thursday of each month, opens at 6:30 AM; Clinic ends at 9 AM.  Patients are seen on a first-come first-served basis, and a limited number are seen during each clinic.   [facility]  [address] Hillard Danker [place], [place] [phone]   Eligibility Requirements You must have lived in [place], [place], or [place] counties for at least the last three months.   You cannot be eligible for state or federal sponsored [employer], including [employer], [place], or [employer].   You generally cannot be eligible for healthcare insurance through your employer.    How to apply: Eligibility screenings are held every Tuesday and Wednesday afternoon from 1:00 pm until 4:00 pm. You do not need an appointment for the interview!  [facility] [address] [place], [place]   [place] Department  [place]  [phone]    Behavioral Health Resources in the Community: Intensive Outpatient Programs Organization         Address  Phone  Notes  [place] [address] [place], [place] [phone]   [facility] Outpatient [address] [place], [place]   ADS: Alcohol & Drug Svcs [address] [place], [place]   [place] [address]  [place], [place] or [phone]     Substance Abuse Resources Organization         Address  Phone  Notes  Alcohol and Drug Services  [phone]   [place]  [phone]   The [place]   Chinita Pester  [phone]   Residential & Outpatient Substance Abuse Program  [phone]   Psychological Services Organization         Address  Phone  Notes  Dent  [place]  [place]   [place]  Health [address], [place] [phone] or [phone]    Mobile Crisis Teams Organization         Address  Phone  Notes  [facility], Mobile Crisis Care Unit  [phone]   [facility]  [address] [place], [place] [phone]   Doristine LocksSharon DeEsch [address] [place] [phone]    Self-Help/Support Groups Organization         Address  Phone             Notes  Mental Health Assoc. of [place] - variety of support groups  336- [phone] Call for more information  Narcotics Anonymous (NA), [facility] [address], [place]  2 meetings at this location   Statisticianesidential Treatment Programs Organization         Address  Phone  Notes  [facility] [address],    [place]  [phone]   [facility]  [address], [place] [phone], [place], [place] [phone]   [facility] [address], [place] [phone] Admissions: 8am-3pm M-F  [facility] [address]    [place], [place] [phone]   [facility] [address], [place], [place] [phone]   [facility] [address]  [place], [place] [phone]   [facility] - Intensive Outpatient [address], [place], [place] [phone]   [facility] ([facility] [address]  [place], [place] [phone] or [phone]   Residential Treatment  Services (RTS) [address] [place], [place] [phone] Accepts Medicaid  [facility] [address]  [place] [phone] Substance Abuse/Addiction Treatment   [facility] Behavioral Health Resources Organization         Address  Phone  Notes  [facility]  [phone](888) [phone]   Angie FavaJulie Brannon, PhD [address], [address] [place], [place]   [phone]) [phone] or [phone]) [phone]   [facility]   [address] [place], [place] [phone](336) [phone]   [facility] [address], [place], [place] [phone](336) [phone] Insurance/Medicaid/sponsorship through [facility]  Faith and Families [address]                                    [place], [place] [phone](336) [phone] Therapy/tele-psych/case  [facility] [address].   [place], [place] [phone]) [phone]    Dr. Lolly MustacheArfeen  [phone](336) [phone]   [facility]  [employer] [facility] 1) [address], [place] 2) [address], [place] 3)  [address], [place] [phone]) [phone]  [phone](336) [phone]   [facility] Child Abuse Hotline [phone]) [phone] or [phone]) [phone] (After Hours)

## 2014-05-21 NOTE — ED Provider Notes (Signed)
CSN: [id number]     Arrival date & time 05/21/14  1032 History   First MD Initiated Contact with Patient 05/21/14 1055     Chief Complaint  Patient presents with  . Altered Mental Status     (Consider location/radiation/quality/duration/timing/severity/associated sxs/prior Treatment) HPI  Evette CristalJerome Buehrer is a 39 y.o. male with PMH of paranoid disorder presenting sig PE. I called the group home administrator who stated patient left the group home without any complaints or problems then went to daycare and there he had a syncopal episode. No report of changes in medications or illicit drug use. He denies any chest pain, shortness of breath, nausea, vomiting, headache, focal weakness. Nursing note states patient was agitated pulling up to the daycare yelling at staff in terms of the floor and would not follow commands quickly. She denies SI, HI, hallucinations or alcohol use.   Past Medical History  Diagnosis Date  . Paranoid disorder    History reviewed. No pertinent past surgical history. History reviewed. No pertinent family history. History  Substance Use Topics  . Smoking status: Never Smoker   . Smokeless tobacco: Not on file  . Alcohol Use: No    Review of Systems 10 Systems reviewed and are negative for acute change except as noted in the HPI.   Allergies  Review of patient's allergies indicates no known allergies.  Home Medications   Prior to Admission medications   Medication Sig Start Date End Date Taking? Authorizing Provider  benztropine (COGENTIN) 1 MG tablet Take 1 mg by mouth at bedtime.    Yes Historical Provider, MD  cloZAPine (CLOZARIL) 100 MG tablet Take 200-500 mg by mouth 2 (two) times daily. Take 200mg  in the morning  and 500mg  at night   Yes Historical Provider, MD  divalproex (DEPAKOTE ER) 250 MG 24 hr tablet Take 750 mg by mouth daily.   Yes Historical Provider, MD  divalproex (DEPAKOTE) 500 MG DR tablet Take 1,000 mg by mouth daily with lunch.   Yes  Historical Provider, MD  docusate sodium (COLACE) 100 MG capsule Take 100 mg by mouth daily.    Yes Historical Provider, MD  LORazepam (ATIVAN) 1 MG tablet Take 1 mg by mouth every 8 (eight) hours as needed for anxiety.   Yes Historical Provider, MD  Lurasidone HCl 120 MG TABS Take 1 tablet by mouth daily.   Yes Historical Provider, MD  Multiple Vitamins-Minerals (MULTIVITAMIN WITH MINERALS) tablet Take 1 tablet by mouth daily.   Yes Historical Provider, MD  omeprazole (PRILOSEC) 20 MG capsule Take 40 mg by mouth daily.   Yes Historical Provider, MD  oxybutynin (DITROPAN) 5 MG tablet Take 5 mg by mouth 3 (three) times daily.   Yes Historical Provider, MD  ibuprofen (ADVIL,MOTRIN) 600 MG tablet Take 1 tablet by mouth every 6 (six) hours as needed. As needed for pain 05/21/14   Historical Provider, MD   BP 137/90 mmHg  Pulse 99  Temp(Src) 97.7 F (36.5 C) (Oral)  Resp 16  SpO2 100% Physical Exam  Constitutional: He appears well-developed and well-nourished. No distress.  HENT:  Head: Normocephalic and atraumatic.  Mouth/Throat: Oropharynx is clear and moist.  Eyes: Conjunctivae and EOM are normal. Pupils are equal, round, and reactive to light. Right eye exhibits no discharge. Left eye exhibits no discharge.  Neck: Normal range of motion. Neck supple.  No nuchal rigidity  Cardiovascular: Normal rate and regular rhythm.   Pulmonary/Chest: Effort normal and breath sounds normal. No respiratory distress. He has  no wheezes.  Abdominal: Soft. Bowel sounds are normal. He exhibits no distension. There is no tenderness.  Neurological: He is alert. No cranial nerve deficit. He exhibits normal muscle tone. Coordination normal.  Speech is pressured but logical and goal oriented. At times is difficult to understand him. Strength 5/5 in upper and lower extremities. Sensation intact. Moves all extremities spontaneously.  No pronator drift. Normal gait.   Skin: Skin is warm and dry. He is not diaphoretic.   Nursing note and vitals reviewed.   ED Course  Procedures (including critical care time) Labs Review Labs Reviewed  ACETAMINOPHEN LEVEL - Abnormal; Notable for the following:    Acetaminophen (Tylenol), Serum <10.0 (*)    All other components within normal limits  I-STAT CHEM 8, ED - Abnormal; Notable for the following:    Calcium, Ion 1.25 (*)    All other components within normal limits  CBC  ETHANOL  URINE RAPID DRUG SCREEN (HOSP PERFORMED)    Imaging Review No results found.   EKG Interpretation   Date/Time:  Wednesday May 21 2014 12:25:08 EDT Ventricular Rate:  100 PR Interval:  132 QRS Duration: 92 QT Interval:  330 QTC Calculation: 425 R Axis:   62 Text Interpretation:  Normal sinus rhythm Possible Right ventricular  hypertrophy Nonspecific T wave abnormality Abnormal ECG No significant  change since last tracing Confirmed by KNAPP  MD-J, JON ([id number]) on  05/21/2014 12:50:54 PM      MDM   Final diagnoses:  Schizophrenia, unspecified type  Syncope, unspecified syncope type   Patient presenting with possible syncopal episode as well as agitation. VSS. Neurological exam without deficits. EKG without acute abnormalities. Lab workup reassuring. NS given and patient with decreased in pressured speech. Medical clearance completed.  Consult to TTS for behavioral health evaluation. No SI, HI, alcohol use. Compliance with medications. Counselor evaluate patient felt no emergent inpatient treatment indicated for psych. I agree with this assessment. Her phone has been notified and is willing to accept patient back. Patient on septic nontoxic and stable for discharge.  Discussed return precautions with patient. Discussed all results and patient verbalizes understanding and agrees with plan.     Oswaldo Conroy, PA-C 05/21/14 1419  Linwood Dibbles, MD 05/21/14 1447

## 2014-05-21 NOTE — ED Notes (Signed)
Patient is made aware that an urine sample is needed. Patient states that he cannot void at this time. Patient is encouraged to void when able.

## 2014-05-21 NOTE — ED Notes (Signed)
Pt sts he was at [facility]" and "I felt like I was getting sick, like you know I was getting my anxiety attacks, but you know I didn't take my little white pill, cause I take that pill before I have attack and then I don't get sick.". Pt's appears somewhat drowsy and is having a hard time keeping his eyes open, however his speech is pressured and he talks very fast.

## 2014-05-21 NOTE — ED Notes (Signed)
Per EMS: pt is a resident of a group home and goes to adult day care daily. Today when he arrived to day care staff reported pt was agitated, yelling at staff, he  threw himself on the floor and would not follow commands so they called an ambulance.

## 2014-05-22 DIAGNOSIS — S01512A Laceration without foreign body of oral cavity, initial encounter: Secondary | ICD-10-CM | POA: Diagnosis not present

## 2014-06-12 DIAGNOSIS — K5900 Constipation, unspecified: Secondary | ICD-10-CM | POA: Diagnosis not present

## 2014-06-18 DIAGNOSIS — Z79899 Other long term (current) drug therapy: Secondary | ICD-10-CM | POA: Diagnosis not present

## 2014-06-18 DIAGNOSIS — F209 Schizophrenia, unspecified: Secondary | ICD-10-CM | POA: Diagnosis not present

## 2014-07-15 DIAGNOSIS — F209 Schizophrenia, unspecified: Secondary | ICD-10-CM | POA: Diagnosis not present

## 2014-07-15 DIAGNOSIS — Z79899 Other long term (current) drug therapy: Secondary | ICD-10-CM | POA: Diagnosis not present

## 2014-08-11 DIAGNOSIS — F419 Anxiety disorder, unspecified: Secondary | ICD-10-CM | POA: Diagnosis not present

## 2014-08-11 DIAGNOSIS — Z23 Encounter for immunization: Secondary | ICD-10-CM | POA: Diagnosis not present

## 2014-08-12 DIAGNOSIS — Z79899 Other long term (current) drug therapy: Secondary | ICD-10-CM | POA: Diagnosis not present

## 2014-08-12 DIAGNOSIS — I10 Essential (primary) hypertension: Secondary | ICD-10-CM | POA: Diagnosis not present

## 2014-08-12 DIAGNOSIS — D696 Thrombocytopenia, unspecified: Secondary | ICD-10-CM | POA: Diagnosis not present

## 2014-08-12 DIAGNOSIS — F209 Schizophrenia, unspecified: Secondary | ICD-10-CM | POA: Diagnosis not present

## 2014-08-12 DIAGNOSIS — R7309 Other abnormal glucose: Secondary | ICD-10-CM | POA: Diagnosis not present

## 2014-08-15 DIAGNOSIS — F209 Schizophrenia, unspecified: Secondary | ICD-10-CM | POA: Diagnosis not present

## 2014-08-19 DIAGNOSIS — Z79899 Other long term (current) drug therapy: Secondary | ICD-10-CM | POA: Diagnosis not present

## 2014-08-19 DIAGNOSIS — F209 Schizophrenia, unspecified: Secondary | ICD-10-CM | POA: Diagnosis not present

## 2014-09-17 DIAGNOSIS — F209 Schizophrenia, unspecified: Secondary | ICD-10-CM | POA: Diagnosis not present

## 2014-09-17 DIAGNOSIS — Z79899 Other long term (current) drug therapy: Secondary | ICD-10-CM | POA: Diagnosis not present

## 2014-10-22 DIAGNOSIS — Z79899 Other long term (current) drug therapy: Secondary | ICD-10-CM | POA: Diagnosis not present

## 2014-10-22 DIAGNOSIS — F209 Schizophrenia, unspecified: Secondary | ICD-10-CM | POA: Diagnosis not present

## 2014-11-19 DIAGNOSIS — F209 Schizophrenia, unspecified: Secondary | ICD-10-CM | POA: Diagnosis not present

## 2014-11-19 DIAGNOSIS — Z79899 Other long term (current) drug therapy: Secondary | ICD-10-CM | POA: Diagnosis not present

## 2014-11-24 DIAGNOSIS — F209 Schizophrenia, unspecified: Secondary | ICD-10-CM | POA: Diagnosis not present

## 2014-12-22 DIAGNOSIS — Z79899 Other long term (current) drug therapy: Secondary | ICD-10-CM | POA: Diagnosis not present

## 2014-12-22 DIAGNOSIS — F209 Schizophrenia, unspecified: Secondary | ICD-10-CM | POA: Diagnosis not present

## 2015-01-20 DIAGNOSIS — Z79899 Other long term (current) drug therapy: Secondary | ICD-10-CM | POA: Diagnosis not present

## 2015-01-20 DIAGNOSIS — F209 Schizophrenia, unspecified: Secondary | ICD-10-CM | POA: Diagnosis not present

## 2015-02-06 DIAGNOSIS — F419 Anxiety disorder, unspecified: Secondary | ICD-10-CM | POA: Diagnosis not present

## 2015-02-06 DIAGNOSIS — D696 Thrombocytopenia, unspecified: Secondary | ICD-10-CM | POA: Diagnosis not present

## 2015-02-06 DIAGNOSIS — I10 Essential (primary) hypertension: Secondary | ICD-10-CM | POA: Diagnosis not present

## 2015-02-06 DIAGNOSIS — R7309 Other abnormal glucose: Secondary | ICD-10-CM | POA: Diagnosis not present

## 2015-02-06 DIAGNOSIS — Z23 Encounter for immunization: Secondary | ICD-10-CM | POA: Diagnosis not present

## 2015-02-06 DIAGNOSIS — Z049 Encounter for examination and observation for unspecified reason: Secondary | ICD-10-CM | POA: Diagnosis not present

## 2015-02-06 DIAGNOSIS — F209 Schizophrenia, unspecified: Secondary | ICD-10-CM | POA: Diagnosis not present

## 2015-02-10 DIAGNOSIS — F209 Schizophrenia, unspecified: Secondary | ICD-10-CM | POA: Diagnosis not present

## 2015-02-26 DIAGNOSIS — F209 Schizophrenia, unspecified: Secondary | ICD-10-CM | POA: Diagnosis not present

## 2015-02-26 DIAGNOSIS — Z79899 Other long term (current) drug therapy: Secondary | ICD-10-CM | POA: Diagnosis not present

## 2015-04-02 DIAGNOSIS — F209 Schizophrenia, unspecified: Secondary | ICD-10-CM | POA: Diagnosis not present

## 2015-04-02 DIAGNOSIS — Z79899 Other long term (current) drug therapy: Secondary | ICD-10-CM | POA: Diagnosis not present

## 2015-04-08 DIAGNOSIS — F209 Schizophrenia, unspecified: Secondary | ICD-10-CM | POA: Diagnosis not present

## 2015-05-01 DIAGNOSIS — F209 Schizophrenia, unspecified: Secondary | ICD-10-CM | POA: Diagnosis not present

## 2015-05-01 DIAGNOSIS — Z79899 Other long term (current) drug therapy: Secondary | ICD-10-CM | POA: Diagnosis not present

## 2015-06-05 DIAGNOSIS — F209 Schizophrenia, unspecified: Secondary | ICD-10-CM | POA: Diagnosis not present

## 2015-07-17 DIAGNOSIS — F209 Schizophrenia, unspecified: Secondary | ICD-10-CM | POA: Diagnosis not present

## 2015-07-17 DIAGNOSIS — Z79899 Other long term (current) drug therapy: Secondary | ICD-10-CM | POA: Diagnosis not present

## 2015-08-19 DIAGNOSIS — F209 Schizophrenia, unspecified: Secondary | ICD-10-CM | POA: Diagnosis not present

## 2015-08-19 DIAGNOSIS — Z79899 Other long term (current) drug therapy: Secondary | ICD-10-CM | POA: Diagnosis not present

## 2015-09-25 DIAGNOSIS — F209 Schizophrenia, unspecified: Secondary | ICD-10-CM | POA: Diagnosis not present

## 2015-09-25 DIAGNOSIS — Z79899 Other long term (current) drug therapy: Secondary | ICD-10-CM | POA: Diagnosis not present

## 2015-10-02 DIAGNOSIS — Z79899 Other long term (current) drug therapy: Secondary | ICD-10-CM | POA: Diagnosis not present

## 2015-10-02 DIAGNOSIS — F209 Schizophrenia, unspecified: Secondary | ICD-10-CM | POA: Diagnosis not present

## 2015-10-23 DIAGNOSIS — Z79899 Other long term (current) drug therapy: Secondary | ICD-10-CM | POA: Diagnosis not present

## 2015-10-23 DIAGNOSIS — F209 Schizophrenia, unspecified: Secondary | ICD-10-CM | POA: Diagnosis not present

## 2015-11-09 DIAGNOSIS — Z79899 Other long term (current) drug therapy: Secondary | ICD-10-CM | POA: Diagnosis not present

## 2015-11-09 DIAGNOSIS — F410 Panic disorder [episodic paroxysmal anxiety] without agoraphobia: Secondary | ICD-10-CM | POA: Diagnosis not present

## 2015-11-09 DIAGNOSIS — F209 Schizophrenia, unspecified: Secondary | ICD-10-CM | POA: Diagnosis not present

## 2015-11-20 DIAGNOSIS — F209 Schizophrenia, unspecified: Secondary | ICD-10-CM | POA: Diagnosis not present

## 2015-11-20 DIAGNOSIS — Z79899 Other long term (current) drug therapy: Secondary | ICD-10-CM | POA: Diagnosis not present

## 2015-12-04 DIAGNOSIS — F209 Schizophrenia, unspecified: Secondary | ICD-10-CM | POA: Diagnosis not present

## 2015-12-04 DIAGNOSIS — Z79899 Other long term (current) drug therapy: Secondary | ICD-10-CM | POA: Diagnosis not present

## 2015-12-07 DIAGNOSIS — F209 Schizophrenia, unspecified: Secondary | ICD-10-CM | POA: Diagnosis not present

## 2016-01-05 DIAGNOSIS — F209 Schizophrenia, unspecified: Secondary | ICD-10-CM | POA: Diagnosis not present

## 2016-01-05 DIAGNOSIS — Z79899 Other long term (current) drug therapy: Secondary | ICD-10-CM | POA: Diagnosis not present

## 2016-01-15 DIAGNOSIS — F209 Schizophrenia, unspecified: Secondary | ICD-10-CM | POA: Diagnosis not present

## 2016-01-15 DIAGNOSIS — Z79899 Other long term (current) drug therapy: Secondary | ICD-10-CM | POA: Diagnosis not present

## 2016-01-29 DIAGNOSIS — F419 Anxiety disorder, unspecified: Secondary | ICD-10-CM | POA: Diagnosis not present

## 2016-01-29 DIAGNOSIS — I10 Essential (primary) hypertension: Secondary | ICD-10-CM | POA: Diagnosis not present

## 2016-01-29 DIAGNOSIS — Z23 Encounter for immunization: Secondary | ICD-10-CM | POA: Diagnosis not present

## 2016-01-29 DIAGNOSIS — Z79899 Other long term (current) drug therapy: Secondary | ICD-10-CM | POA: Diagnosis not present

## 2016-01-29 DIAGNOSIS — R7301 Impaired fasting glucose: Secondary | ICD-10-CM | POA: Diagnosis not present

## 2016-01-29 DIAGNOSIS — F209 Schizophrenia, unspecified: Secondary | ICD-10-CM | POA: Diagnosis not present

## 2016-01-29 DIAGNOSIS — Z049 Encounter for examination and observation for unspecified reason: Secondary | ICD-10-CM | POA: Diagnosis not present

## 2016-02-01 DIAGNOSIS — Z79899 Other long term (current) drug therapy: Secondary | ICD-10-CM | POA: Diagnosis not present

## 2016-02-01 DIAGNOSIS — F209 Schizophrenia, unspecified: Secondary | ICD-10-CM | POA: Diagnosis not present

## 2016-02-19 DIAGNOSIS — Z79899 Other long term (current) drug therapy: Secondary | ICD-10-CM | POA: Diagnosis not present

## 2016-02-19 DIAGNOSIS — F209 Schizophrenia, unspecified: Secondary | ICD-10-CM | POA: Diagnosis not present

## 2016-03-02 DIAGNOSIS — Z79899 Other long term (current) drug therapy: Secondary | ICD-10-CM | POA: Diagnosis not present

## 2016-03-02 DIAGNOSIS — F209 Schizophrenia, unspecified: Secondary | ICD-10-CM | POA: Diagnosis not present

## 2016-03-15 DIAGNOSIS — F209 Schizophrenia, unspecified: Secondary | ICD-10-CM | POA: Diagnosis not present

## 2016-03-15 DIAGNOSIS — Z79899 Other long term (current) drug therapy: Secondary | ICD-10-CM | POA: Diagnosis not present

## 2016-04-11 DIAGNOSIS — F209 Schizophrenia, unspecified: Secondary | ICD-10-CM | POA: Diagnosis not present

## 2016-04-12 DIAGNOSIS — Z79899 Other long term (current) drug therapy: Secondary | ICD-10-CM | POA: Diagnosis not present

## 2016-04-12 DIAGNOSIS — F209 Schizophrenia, unspecified: Secondary | ICD-10-CM | POA: Diagnosis not present

## 2016-06-03 DIAGNOSIS — F209 Schizophrenia, unspecified: Secondary | ICD-10-CM | POA: Diagnosis not present

## 2016-06-03 DIAGNOSIS — Z79899 Other long term (current) drug therapy: Secondary | ICD-10-CM | POA: Diagnosis not present

## 2016-07-07 DIAGNOSIS — Z79899 Other long term (current) drug therapy: Secondary | ICD-10-CM | POA: Diagnosis not present

## 2016-07-07 DIAGNOSIS — F209 Schizophrenia, unspecified: Secondary | ICD-10-CM | POA: Diagnosis not present

## 2016-08-03 DIAGNOSIS — F209 Schizophrenia, unspecified: Secondary | ICD-10-CM | POA: Diagnosis not present

## 2016-08-03 DIAGNOSIS — Z79899 Other long term (current) drug therapy: Secondary | ICD-10-CM | POA: Diagnosis not present

## 2016-09-07 DIAGNOSIS — Z79899 Other long term (current) drug therapy: Secondary | ICD-10-CM | POA: Diagnosis not present

## 2016-09-30 DIAGNOSIS — Z79899 Other long term (current) drug therapy: Secondary | ICD-10-CM | POA: Diagnosis not present

## 2016-10-28 DIAGNOSIS — F209 Schizophrenia, unspecified: Secondary | ICD-10-CM | POA: Diagnosis not present

## 2016-10-28 DIAGNOSIS — F410 Panic disorder [episodic paroxysmal anxiety] without agoraphobia: Secondary | ICD-10-CM | POA: Diagnosis not present

## 2016-11-24 DIAGNOSIS — B079 Viral wart, unspecified: Secondary | ICD-10-CM | POA: Diagnosis not present

## 2016-11-24 DIAGNOSIS — Z23 Encounter for immunization: Secondary | ICD-10-CM | POA: Diagnosis not present

## 2016-12-02 DIAGNOSIS — F209 Schizophrenia, unspecified: Secondary | ICD-10-CM | POA: Diagnosis not present

## 2016-12-02 DIAGNOSIS — Z79899 Other long term (current) drug therapy: Secondary | ICD-10-CM | POA: Diagnosis not present

## 2016-12-05 DIAGNOSIS — F209 Schizophrenia, unspecified: Secondary | ICD-10-CM | POA: Diagnosis not present

## 2016-12-28 DIAGNOSIS — Z79899 Other long term (current) drug therapy: Secondary | ICD-10-CM | POA: Diagnosis not present

## 2016-12-28 DIAGNOSIS — F209 Schizophrenia, unspecified: Secondary | ICD-10-CM | POA: Diagnosis not present

## 2017-01-26 DIAGNOSIS — F209 Schizophrenia, unspecified: Secondary | ICD-10-CM | POA: Diagnosis not present

## 2017-01-26 DIAGNOSIS — F410 Panic disorder [episodic paroxysmal anxiety] without agoraphobia: Secondary | ICD-10-CM | POA: Diagnosis not present

## 2017-02-24 DIAGNOSIS — L851 Acquired keratosis [keratoderma] palmaris et plantaris: Secondary | ICD-10-CM | POA: Diagnosis not present

## 2017-02-24 DIAGNOSIS — M79671 Pain in right foot: Secondary | ICD-10-CM | POA: Diagnosis not present

## 2017-02-28 DIAGNOSIS — F410 Panic disorder [episodic paroxysmal anxiety] without agoraphobia: Secondary | ICD-10-CM | POA: Diagnosis not present

## 2017-02-28 DIAGNOSIS — F209 Schizophrenia, unspecified: Secondary | ICD-10-CM | POA: Diagnosis not present

## 2017-03-09 DIAGNOSIS — R399 Unspecified symptoms and signs involving the genitourinary system: Secondary | ICD-10-CM | POA: Diagnosis not present

## 2017-03-09 DIAGNOSIS — R350 Frequency of micturition: Secondary | ICD-10-CM | POA: Diagnosis not present

## 2017-03-23 DIAGNOSIS — F410 Panic disorder [episodic paroxysmal anxiety] without agoraphobia: Secondary | ICD-10-CM | POA: Diagnosis not present

## 2017-03-23 DIAGNOSIS — F209 Schizophrenia, unspecified: Secondary | ICD-10-CM | POA: Diagnosis not present

## 2017-04-05 DIAGNOSIS — F410 Panic disorder [episodic paroxysmal anxiety] without agoraphobia: Secondary | ICD-10-CM | POA: Diagnosis not present

## 2017-04-05 DIAGNOSIS — F209 Schizophrenia, unspecified: Secondary | ICD-10-CM | POA: Diagnosis not present

## 2017-04-19 DIAGNOSIS — F209 Schizophrenia, unspecified: Secondary | ICD-10-CM | POA: Diagnosis not present

## 2017-05-02 DIAGNOSIS — F410 Panic disorder [episodic paroxysmal anxiety] without agoraphobia: Secondary | ICD-10-CM | POA: Diagnosis not present

## 2017-05-02 DIAGNOSIS — F209 Schizophrenia, unspecified: Secondary | ICD-10-CM | POA: Diagnosis not present

## 2017-06-01 DIAGNOSIS — F209 Schizophrenia, unspecified: Secondary | ICD-10-CM | POA: Diagnosis not present

## 2017-06-01 DIAGNOSIS — F410 Panic disorder [episodic paroxysmal anxiety] without agoraphobia: Secondary | ICD-10-CM | POA: Diagnosis not present

## 2017-06-23 DIAGNOSIS — K219 Gastro-esophageal reflux disease without esophagitis: Secondary | ICD-10-CM | POA: Diagnosis not present

## 2017-06-23 DIAGNOSIS — R5383 Other fatigue: Secondary | ICD-10-CM | POA: Diagnosis not present

## 2017-06-23 DIAGNOSIS — Z79899 Other long term (current) drug therapy: Secondary | ICD-10-CM | POA: Diagnosis not present

## 2017-06-23 DIAGNOSIS — R350 Frequency of micturition: Secondary | ICD-10-CM | POA: Diagnosis not present

## 2017-06-23 DIAGNOSIS — F319 Bipolar disorder, unspecified: Secondary | ICD-10-CM | POA: Diagnosis not present

## 2017-07-03 DIAGNOSIS — F209 Schizophrenia, unspecified: Secondary | ICD-10-CM | POA: Diagnosis not present

## 2017-07-03 DIAGNOSIS — F410 Panic disorder [episodic paroxysmal anxiety] without agoraphobia: Secondary | ICD-10-CM | POA: Diagnosis not present

## 2017-07-13 DIAGNOSIS — F209 Schizophrenia, unspecified: Secondary | ICD-10-CM | POA: Diagnosis not present

## 2017-07-13 DIAGNOSIS — F419 Anxiety disorder, unspecified: Secondary | ICD-10-CM | POA: Diagnosis not present

## 2017-08-02 DIAGNOSIS — F209 Schizophrenia, unspecified: Secondary | ICD-10-CM | POA: Diagnosis not present

## 2017-09-04 DIAGNOSIS — R350 Frequency of micturition: Secondary | ICD-10-CM | POA: Diagnosis not present

## 2017-09-07 DIAGNOSIS — F209 Schizophrenia, unspecified: Secondary | ICD-10-CM | POA: Diagnosis not present

## 2017-09-07 DIAGNOSIS — F419 Anxiety disorder, unspecified: Secondary | ICD-10-CM | POA: Diagnosis not present

## 2017-09-26 DIAGNOSIS — F419 Anxiety disorder, unspecified: Secondary | ICD-10-CM | POA: Diagnosis not present

## 2017-09-26 DIAGNOSIS — F209 Schizophrenia, unspecified: Secondary | ICD-10-CM | POA: Diagnosis not present

## 2017-10-04 DIAGNOSIS — F209 Schizophrenia, unspecified: Secondary | ICD-10-CM | POA: Diagnosis not present

## 2017-10-25 DIAGNOSIS — R3911 Hesitancy of micturition: Secondary | ICD-10-CM | POA: Diagnosis not present

## 2017-10-25 DIAGNOSIS — R350 Frequency of micturition: Secondary | ICD-10-CM | POA: Diagnosis not present

## 2017-11-01 DIAGNOSIS — F209 Schizophrenia, unspecified: Secondary | ICD-10-CM | POA: Diagnosis not present

## 2017-11-01 DIAGNOSIS — Z79899 Other long term (current) drug therapy: Secondary | ICD-10-CM | POA: Diagnosis not present

## 2017-12-05 DIAGNOSIS — Z79899 Other long term (current) drug therapy: Secondary | ICD-10-CM | POA: Diagnosis not present

## 2017-12-05 DIAGNOSIS — F209 Schizophrenia, unspecified: Secondary | ICD-10-CM | POA: Diagnosis not present

## 2017-12-12 DIAGNOSIS — F419 Anxiety disorder, unspecified: Secondary | ICD-10-CM | POA: Diagnosis not present

## 2017-12-12 DIAGNOSIS — F209 Schizophrenia, unspecified: Secondary | ICD-10-CM | POA: Diagnosis not present

## 2017-12-18 DIAGNOSIS — I10 Essential (primary) hypertension: Secondary | ICD-10-CM | POA: Diagnosis not present

## 2017-12-18 DIAGNOSIS — Z23 Encounter for immunization: Secondary | ICD-10-CM | POA: Diagnosis not present

## 2017-12-18 DIAGNOSIS — R7309 Other abnormal glucose: Secondary | ICD-10-CM | POA: Diagnosis not present

## 2017-12-18 DIAGNOSIS — F209 Schizophrenia, unspecified: Secondary | ICD-10-CM | POA: Diagnosis not present

## 2017-12-18 DIAGNOSIS — F411 Generalized anxiety disorder: Secondary | ICD-10-CM | POA: Diagnosis not present

## 2018-01-03 DIAGNOSIS — Z79899 Other long term (current) drug therapy: Secondary | ICD-10-CM | POA: Diagnosis not present

## 2018-01-03 DIAGNOSIS — F209 Schizophrenia, unspecified: Secondary | ICD-10-CM | POA: Diagnosis not present

## 2018-01-14 ENCOUNTER — Emergency Department (HOSPITAL_COMMUNITY): Payer: Medicare Other

## 2018-01-14 ENCOUNTER — Emergency Department (HOSPITAL_COMMUNITY)
Admission: EM | Admit: 2018-01-14 | Discharge: 2018-01-14 | Disposition: A | Discharge status: Home / Self Care | Payer: Medicare Other | Attending: Emergency Medicine | Admitting: Emergency Medicine

## 2018-01-14 ENCOUNTER — Encounter (HOSPITAL_COMMUNITY): Payer: Self-pay

## 2018-01-14 DIAGNOSIS — Z79899 Other long term (current) drug therapy: Secondary | ICD-10-CM | POA: Diagnosis not present

## 2018-01-14 DIAGNOSIS — J984 Other disorders of lung: Secondary | ICD-10-CM | POA: Diagnosis not present

## 2018-01-14 DIAGNOSIS — S0990XA Unspecified injury of head, initial encounter: Secondary | ICD-10-CM | POA: Diagnosis not present

## 2018-01-14 DIAGNOSIS — Y999 Unspecified external cause status: Secondary | ICD-10-CM | POA: Diagnosis not present

## 2018-01-14 DIAGNOSIS — Y9389 Activity, other specified: Secondary | ICD-10-CM | POA: Diagnosis not present

## 2018-01-14 DIAGNOSIS — R000 Tachycardia, unspecified: Secondary | ICD-10-CM | POA: Diagnosis not present

## 2018-01-14 DIAGNOSIS — I959 Hypotension, unspecified: Secondary | ICD-10-CM | POA: Diagnosis not present

## 2018-01-14 DIAGNOSIS — R569 Unspecified convulsions: Secondary | ICD-10-CM | POA: Diagnosis not present

## 2018-01-14 DIAGNOSIS — Y92192 Bathroom in other specified residential institution as the place of occurrence of the external cause: Secondary | ICD-10-CM | POA: Diagnosis not present

## 2018-01-14 DIAGNOSIS — S199XXA Unspecified injury of neck, initial encounter: Secondary | ICD-10-CM | POA: Diagnosis not present

## 2018-01-14 DIAGNOSIS — W01198A Fall on same level from slipping, tripping and stumbling with subsequent striking against other object, initial encounter: Secondary | ICD-10-CM | POA: Insufficient documentation

## 2018-01-14 DIAGNOSIS — F29 Unspecified psychosis not due to a substance or known physiological condition: Secondary | ICD-10-CM | POA: Diagnosis not present

## 2018-01-14 DIAGNOSIS — W19XXXA Unspecified fall, initial encounter: Secondary | ICD-10-CM

## 2018-01-14 DIAGNOSIS — R404 Transient alteration of awareness: Secondary | ICD-10-CM | POA: Diagnosis not present

## 2018-01-14 DIAGNOSIS — R4182 Altered mental status, unspecified: Secondary | ICD-10-CM | POA: Diagnosis not present

## 2018-01-14 LAB — CBC WITH DIFFERENTIAL/PLATELET
ABS IMMATURE GRANULOCYTES: 0.03 10*3/uL (ref 0.00–0.07)
BASOS PCT: 0 %
Basophils Absolute: 0 10*3/uL (ref 0.0–0.1)
Eosinophils Absolute: 0 10*3/uL (ref 0.0–0.5)
Eosinophils Relative: 1 %
HCT: 41.7 % (ref 39.0–52.0)
HEMOGLOBIN: 13.5 g/dL (ref 13.0–17.0)
IMMATURE GRANULOCYTES: 0 %
Lymphocytes Relative: 15 %
Lymphs Abs: 1.3 10*3/uL (ref 0.7–4.0)
MCH: 30.5 pg (ref 26.0–34.0)
MCHC: 32.4 g/dL (ref 30.0–36.0)
MCV: 94.3 fL (ref 80.0–100.0)
MONO ABS: 0.7 10*3/uL (ref 0.1–1.0)
MONOS PCT: 8 %
NEUTROS ABS: 6.6 10*3/uL (ref 1.7–7.7)
NEUTROS PCT: 76 %
Platelets: 170 10*3/uL (ref 150–400)
RBC: 4.42 MIL/uL (ref 4.22–5.81)
RDW: 13.7 % (ref 11.5–15.5)
WBC: 8.7 10*3/uL (ref 4.0–10.5)
nRBC: 0 % (ref 0.0–0.2)

## 2018-01-14 LAB — RAPID URINE DRUG SCREEN, HOSP PERFORMED
Amphetamines: NOT DETECTED
BARBITURATES: NOT DETECTED
Benzodiazepines: NOT DETECTED
COCAINE: NOT DETECTED
Opiates: NOT DETECTED
Tetrahydrocannabinol: NOT DETECTED

## 2018-01-14 LAB — COMPREHENSIVE METABOLIC PANEL
ALK PHOS: 56 U/L (ref 38–126)
ALT: 11 U/L (ref 0–44)
AST: 22 U/L (ref 15–41)
Albumin: 3.9 g/dL (ref 3.5–5.0)
Anion gap: 10 (ref 5–15)
BUN: 16 mg/dL (ref 6–20)
CALCIUM: 9.1 mg/dL (ref 8.9–10.3)
CO2: 24 mmol/L (ref 22–32)
Chloride: 106 mmol/L (ref 98–111)
Creatinine, Ser: 1.36 mg/dL — ABNORMAL HIGH (ref 0.61–1.24)
Glucose, Bld: 118 mg/dL — ABNORMAL HIGH (ref 70–99)
Potassium: 4.6 mmol/L (ref 3.5–5.1)
Sodium: 140 mmol/L (ref 135–145)
Total Bilirubin: 0.7 mg/dL (ref 0.3–1.2)
Total Protein: 6.9 g/dL (ref 6.5–8.1)

## 2018-01-14 LAB — URINALYSIS, ROUTINE W REFLEX MICROSCOPIC
Bilirubin Urine: NEGATIVE
GLUCOSE, UA: NEGATIVE mg/dL
HGB URINE DIPSTICK: NEGATIVE
Ketones, ur: NEGATIVE mg/dL
LEUKOCYTES UA: NEGATIVE
Nitrite: NEGATIVE
PROTEIN: NEGATIVE mg/dL
SPECIFIC GRAVITY, URINE: 1.025 (ref 1.005–1.030)
pH: 7 (ref 5.0–8.0)

## 2018-01-14 LAB — ETHANOL: Alcohol, Ethyl (B): <10 mg/dL (ref <10)

## 2018-01-14 MED ORDER — LEVETIRACETAM 500 MG PO TABS
500.0000 mg | ORAL_TABLET | Freq: Once | ORAL | Status: AC
  Administered 2018-01-14: 500 mg via ORAL
  Filled 2018-01-14: qty 1

## 2018-01-14 MED ORDER — LEVETIRACETAM 500 MG PO TABS: 500.0000 mg | ORAL_TABLET | Freq: Two times a day (BID) | ORAL | 0 refills | Status: DC

## 2018-01-14 NOTE — ED Notes (Signed)
RN attempted to call report for Pt pickup x2

## 2018-01-14 NOTE — Discharge Instructions (Addendum)
Take Keppra as prescribed, twice daily. Call Neurology to schedule a follow up appointment. Do NOT drive until evaluated by Neurology.

## 2018-01-14 NOTE — ED Provider Notes (Signed)
[facility] EMERGENCY DEPARTMENT Provider Note   CSN: [id number] Arrival date & time: 01/14/18  1139     History   Chief Complaint Chief Complaint  Patient presents with  . Fall    HPI Blake Long. is a 42 y.o. male.  42 year old male brought in by EMS for evaluation after a fall.  Patient states that he has a history of twitching episodes, states he was walking into the bathroom today when he had 1 of his twitching episodes resulting in a fall.  Patient does not recall falling, states that he woke up lying on the bathroom floor when someone from the group home found him there and called 911.  Patient states that he has had these episodes ever since he was in a mental health hospital several years ago, states they have been more frequent for the past week.  Twitching episode described as feeling his body jerked suddenly and losing his train of thought in the moment.  Patient denies any injuries today, denies loss of bladder control, did not bite his tongue.  No history of seizures.  Patient states that he was evaluated for these twitching episodes several years ago and was told that he would have them for the rest of his life but does not know a specific diagnosis.  No other complaints or concerns today.     Past Medical History:  Diagnosis Date  . Paranoid disorder (HCC)     There are no active problems to display for this patient.   History reviewed. No pertinent surgical history.      Home Medications    Prior to Admission medications   Medication Sig Start Date End Date Taking? Authorizing Provider  benztropine (COGENTIN) 1 MG tablet Take 1 mg by mouth at bedtime.    Yes [provider]  busPIRone (BUSPAR) 5 MG tablet Take 5 mg by mouth 3 (three) times daily.   Yes [provider]  divalproex (DEPAKOTE ER) 500 MG 24 hr tablet Take 1,000 mg by mouth daily.    Yes [provider]  omeprazole (PRILOSEC) 20 MG capsule Take  40 mg by mouth daily.   Yes [provider]  levETIRAcetam (KEPPRA) 500 MG tablet Take 1 tablet (500 mg total) by mouth 2 (two) times daily. 01/14/18 02/13/18  Jeannie Fend, PA-C  LORazepam (ATIVAN) 0.5 MG tablet Take 0.5 mg by mouth 2 (two) times daily.    [provider]  Lurasidone HCl 120 MG TABS Take 120 mg by mouth daily.     [provider]  oxybutynin (DITROPAN) 5 MG tablet Take 5 mg by mouth 2 (two) times daily.    [provider]    Family History History reviewed. No pertinent family history.  Social History Social History   Tobacco Use  . Smoking status: Never Smoker  Substance Use Topics  . Alcohol use: No  . Drug use: Not on file     Allergies   Patient has no known allergies.   Review of Systems Review of Systems  Constitutional: Negative for fever.  Eyes: Negative for visual disturbance.  Respiratory: Negative for chest tightness and shortness of breath.   Cardiovascular: Negative for chest pain.  Gastrointestinal: Negative for abdominal pain, nausea and vomiting.  Skin: Negative for rash and wound.  Allergic/Immunologic: Negative for immunocompromised state.  Neurological: Negative for dizziness, weakness and headaches.  Hematological: Does not bruise/bleed easily.  Psychiatric/Behavioral: Negative for confusion.  All other systems reviewed and  are negative.    Physical Exam Updated Vital Signs BP (!) 125/92   Pulse (!) 109   Resp 17   SpO2 100%   Physical Exam Vitals signs and nursing note reviewed.  Constitutional:      General: He is not in acute distress.    Appearance: He is well-developed. He is not diaphoretic.  HENT:     Head: Normocephalic and atraumatic.     Nose: Nose normal.     Mouth/Throat:     Mouth: Mucous membranes are moist.  Eyes:     Extraocular Movements: Extraocular movements intact.     Conjunctiva/sclera: Conjunctivae normal.     Pupils: Pupils are equal, round, and reactive to  light.  Neck:     Musculoskeletal: No muscular tenderness.  Cardiovascular:     Rate and Rhythm: Normal rate and regular rhythm.     Pulses: Normal pulses.     Heart sounds: Normal heart sounds. No murmur.  Pulmonary:     Effort: Pulmonary effort is normal.     Breath sounds: Normal breath sounds.  Abdominal:     General: Abdomen is flat.     Palpations: Abdomen is soft.     Tenderness: There is no abdominal tenderness.  Musculoskeletal:        General: No tenderness or deformity.  Skin:    General: Skin is warm and dry.     Findings: No rash.  Neurological:     Mental Status: He is alert and oriented to person, place, and time.     Cranial Nerves: No cranial nerve deficit.     Sensory: No sensory deficit.     Motor: No weakness.  Psychiatric:        Mood and Affect: Mood normal.        Behavior: Behavior normal.      ED Treatments / Results  Labs (all labs ordered are listed, but only abnormal results are displayed) Labs Reviewed  COMPREHENSIVE METABOLIC PANEL - Abnormal; Notable for the following components:      Result Value   Glucose, Bld 118 (*)    Creatinine, Ser 1.36 (*)    All other components within normal limits  URINALYSIS, ROUTINE W REFLEX MICROSCOPIC - Abnormal; Notable for the following components:   Color, Urine YELLOW (*)    APPearance CLEAR (*)    All other components within normal limits  RAPID URINE DRUG SCREEN, HOSP PERFORMED  CBC WITH DIFFERENTIAL/PLATELET  ETHANOL    EKG None  Radiology Ct Head Wo Contrast  Result Date: 01/14/2018 CLINICAL DATA:  Unwitnessed fall in bathroom, found down. EXAM: CT HEAD WITHOUT CONTRAST CT CERVICAL SPINE WITHOUT CONTRAST TECHNIQUE: Multidetector CT imaging of the head and cervical spine was performed following the standard protocol without intravenous contrast. Multiplanar CT image reconstructions of the cervical spine were also generated. COMPARISON:  CT head 07/24/2012 FINDINGS: CT HEAD FINDINGS Brain: The  brainstem, cerebellum, cerebral peduncles, thalami, basal ganglia, basilar cisterns, and ventricular system appear within normal limits. No intracranial hemorrhage, mass lesion, or acute CVA. Vascular: Unremarkable Skull: Old deformity of the right zygomatic arch, stable. Sinuses/Orbits: Chronic ethmoid and right frontal sinusitis. Other: No supplemental non-categorized findings. CT CERVICAL SPINE FINDINGS Alignment: No vertebral subluxation is observed. Skull base and vertebrae: No fracture observed. Mild spurring anterior to the vertebral body column at C5-6 and C6-7. Soft tissues and spinal canal: Unremarkable Disc levels:  Unremarkable Upper chest: Unremarkable Other: No supplemental non-categorized findings. IMPRESSION: 1. No acute intracranial findings  or acute cervical spine findings. 2. Chronic ethmoid and right frontal sinusitis. Electronically Signed   By: Gaylyn Rong M.D.   On: 01/14/2018 13:22   Ct Cervical Spine Wo Contrast  Result Date: 01/14/2018 CLINICAL DATA:  Unwitnessed fall in bathroom, found down. EXAM: CT HEAD WITHOUT CONTRAST CT CERVICAL SPINE WITHOUT CONTRAST TECHNIQUE: Multidetector CT imaging of the head and cervical spine was performed following the standard protocol without intravenous contrast. Multiplanar CT image reconstructions of the cervical spine were also generated. COMPARISON:  CT head 07/24/2012 FINDINGS: CT HEAD FINDINGS Brain: The brainstem, cerebellum, cerebral peduncles, thalami, basal ganglia, basilar cisterns, and ventricular system appear within normal limits. No intracranial hemorrhage, mass lesion, or acute CVA. Vascular: Unremarkable Skull: Old deformity of the right zygomatic arch, stable. Sinuses/Orbits: Chronic ethmoid and right frontal sinusitis. Other: No supplemental non-categorized findings. CT CERVICAL SPINE FINDINGS Alignment: No vertebral subluxation is observed. Skull base and vertebrae: No fracture observed. Mild spurring anterior to the  vertebral body column at C5-6 and C6-7. Soft tissues and spinal canal: Unremarkable Disc levels:  Unremarkable Upper chest: Unremarkable Other: No supplemental non-categorized findings. IMPRESSION: 1. No acute intracranial findings or acute cervical spine findings. 2. Chronic ethmoid and right frontal sinusitis. Electronically Signed   By: Gaylyn Rong M.D.   On: 01/14/2018 13:22   Dg Chest Port 1 View  Result Date: 01/14/2018 CLINICAL DATA:  Fall this morning. EXAM: PORTABLE CHEST 1 VIEW COMPARISON:  07/24/2012 FINDINGS: Moderate elevation of the left hemidiaphragm. Lungs are hypoinflated with mild opacification adjacent the left hemidiaphragm likely atelectasis. No effusion or pneumothorax. Cardiomediastinal silhouette is within normal. 3.4 cm oval density projects over the splenic flexure likely stool within the colon. No fracture. Remainder the exam is unchanged. IMPRESSION: Hypoinflation with moderate elevation of the left hemidiaphragm. Minimal linear density left mid to lower lung likely atelectasis. Electronically Signed   By: Elberta Fortis M.D.   On: 01/14/2018 12:36    Procedures Procedures (including critical care time)  Medications Ordered in ED Medications  levETIRAcetam (KEPPRA) tablet 500 mg (has no administration in time range)     Initial Impression / Assessment and Plan / ED Course  I have reviewed the triage vital signs and the nursing notes.  Pertinent labs & imaging results that were available during my care of the patient were reviewed by me and considered in my medical decision making (see chart for details).  Clinical Course as of Jan 14 1518  Sun Jan 14, 2018  1515 42yo male brought in by EMS from group home, history of paranoid disorder, stopped psych meds about 1 month ago. States he has a history of body twitching, denies seizure diagnosis, told he would have this twitching for the rest of his life. States today's events/episode are similar to events  previously. Denies injuries from his fall/syncope today, denies chest pain or SHOB, loss of bladder control/did not bite tongue. CT head and c-spine are unremarkable, XR chest with elevated left hemidiaphragm. Discussed with Dr. Dalene Seltzer, plan is to dc on Keppra 500mg  BID with referral to neurology. Patient is agreeable with this plan, now states he may have been told in the past his twitching episodes are seizures. Throughout conversation, patient has occasional jerking movements of his right arm while holding steady conversation. Patient to return to ER for new or worsening symptoms, advised not to drive until evaluated by neurology.    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

## 2018-01-14 NOTE — ED Triage Notes (Signed)
Pt arrived via [facility]; pt from [facility] Pt found in bathroom on floor with unwitnessed fall; Pt off psych meds a months time per facility; 95/57, pt rec'd 500 NS fluids; increased 116/70; 115; CBG 157; 99% on RA

## 2019-01-13 IMAGING — CT CT CERVICAL SPINE W/O CM
4 of 7 series · 14 of 33 positions shown, 15 images · non-contrast
Comparison: CT head 07/24/2012

CLINICAL DATA: Unwitnessed fall in bathroom, found down.

EXAM:
CT HEAD WITHOUT CONTRAST
CT CERVICAL SPINE WITHOUT CONTRAST
TECHNIQUE: Multidetector CT imaging of the head and cervical spine was
performed following the standard protocol without intravenous
contrast. Multiplanar CT image reconstructions of the cervical spine
were also generated.

[Series 5: head 2.0 h70h · axial · 0.45mm/px · z∈[-44,+38]mm · 3 of 83 slices shown]
[im 21/83  bone]
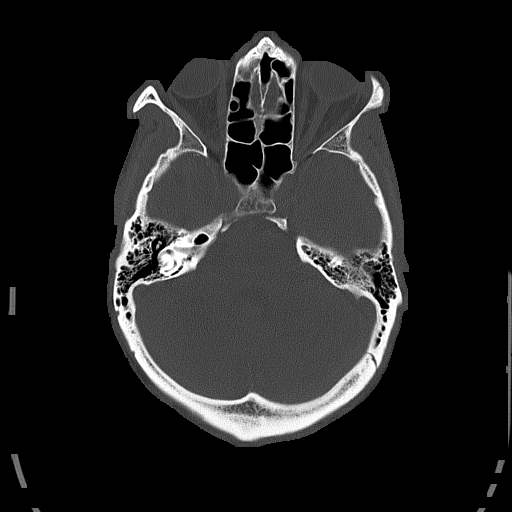
[im 42/83  bone]
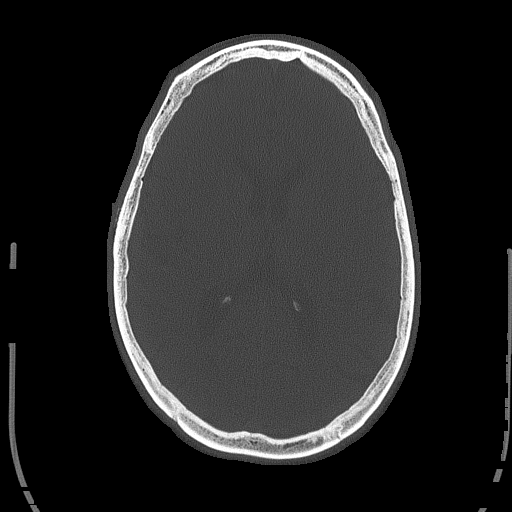
[im 62/83  bone]
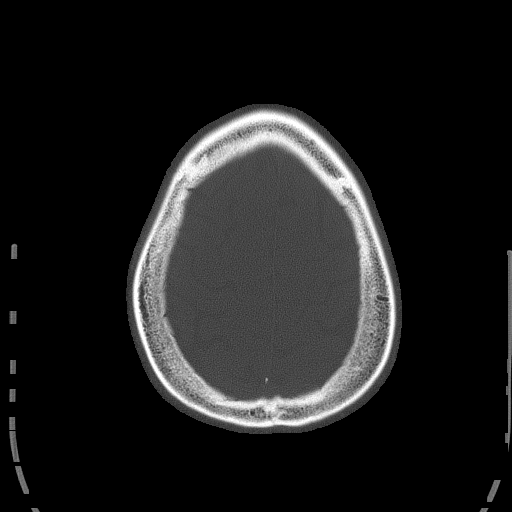

[Series 9: c_spine 2.0 st · axial · 0.41mm/px · z∈[-196,-82]mm · 4 of 97 slices shown, 5 images]
[im 20/97  soft-tissue]
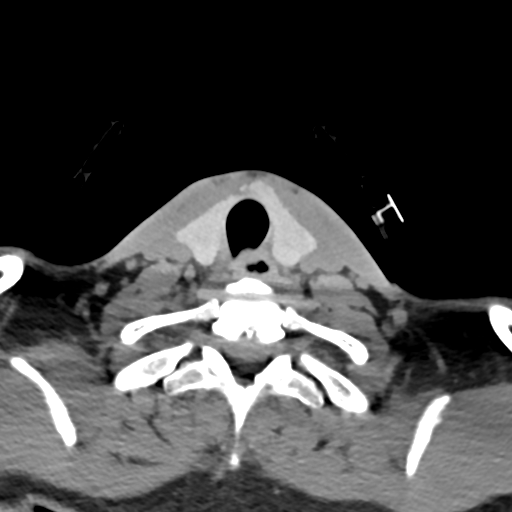
[im 20/97  bone]
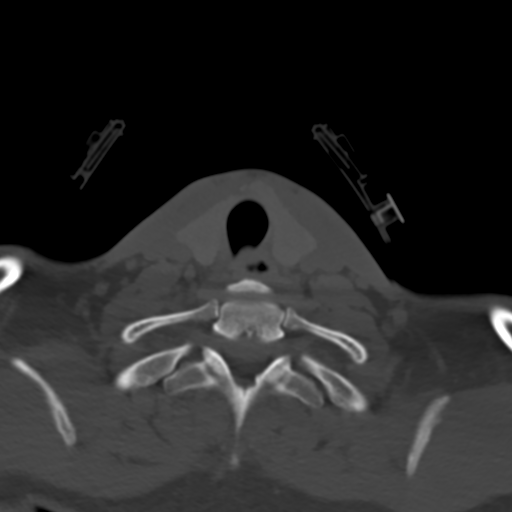
[im 39/97  bone]
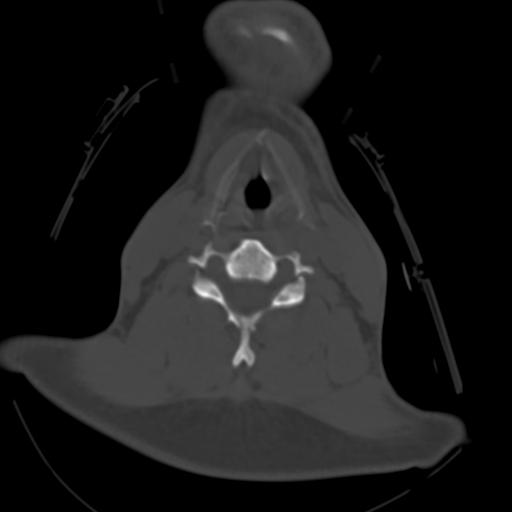
[im 58/97  bone]
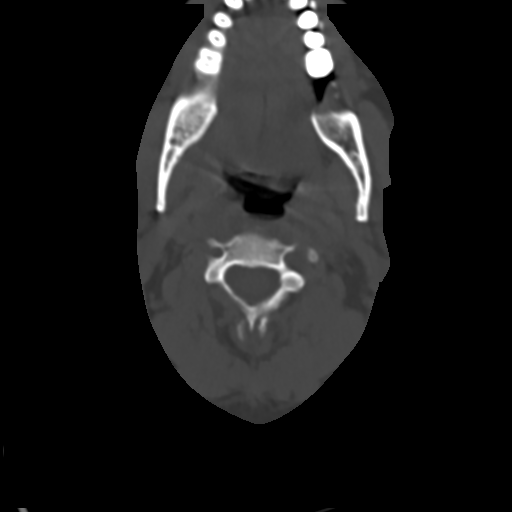
[im 77/97  bone]
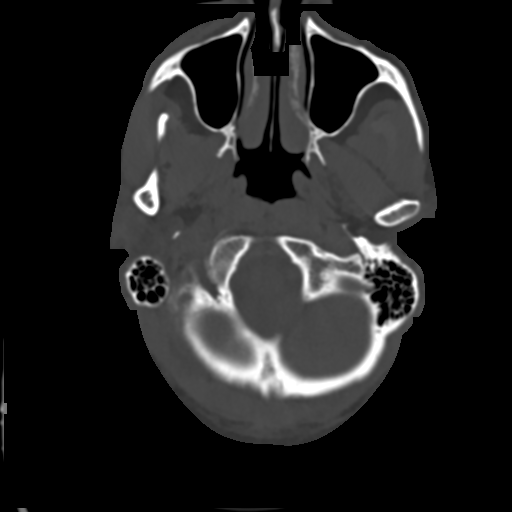

[Series 10: coronal bone · coronal · 0.38mm/px · 2 of 112 slices shown]
[im 38/112  bone]
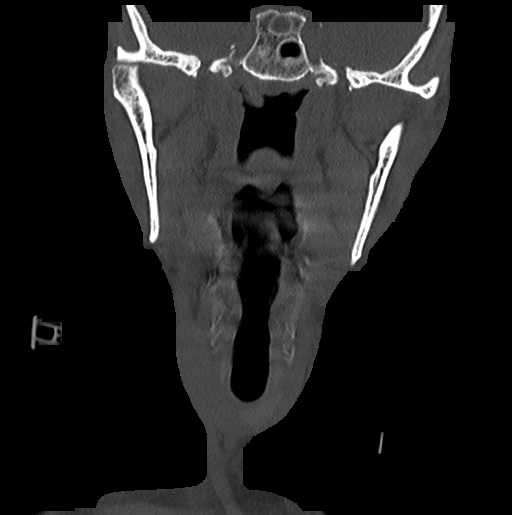
[im 75/112  bone]
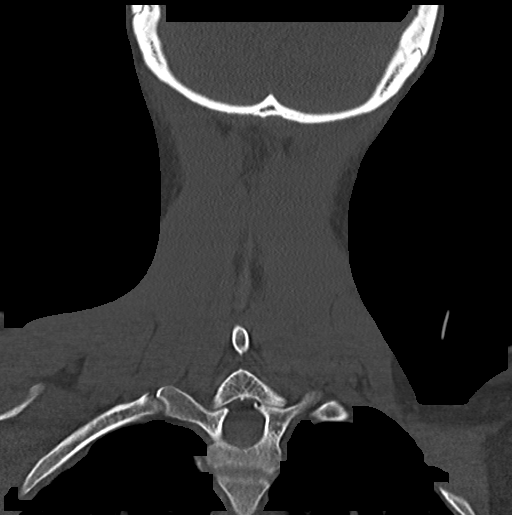

[Series 11: sagittal bone · sagittal · 0.39mm/px · 5 of 86 slices shown]
[im 15/86  bone]
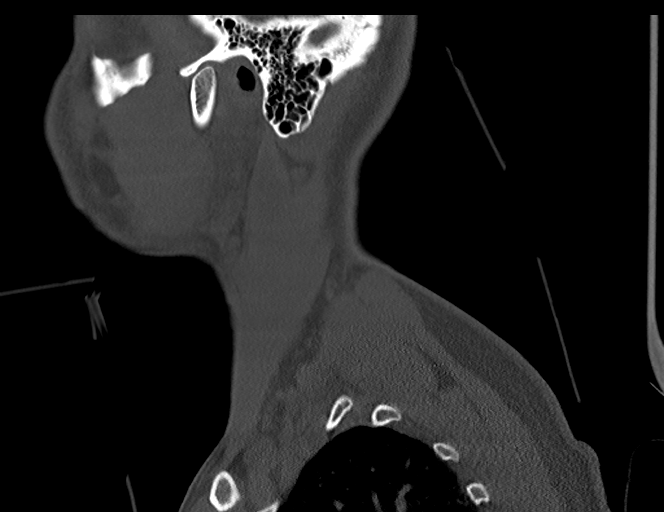
[im 29/86  bone]
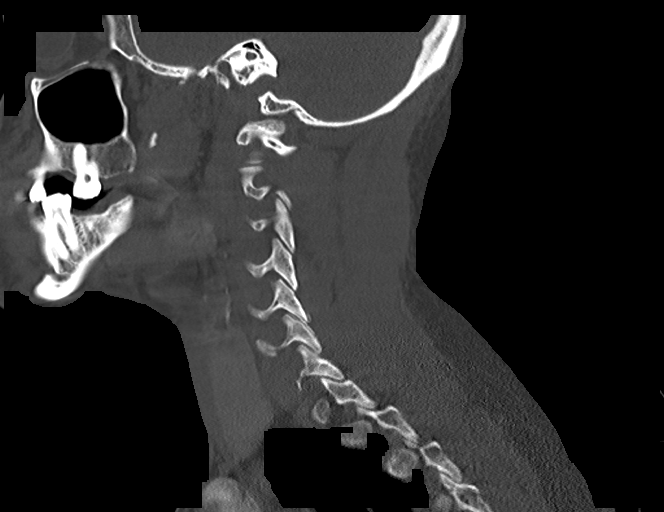
[im 43/86  bone]
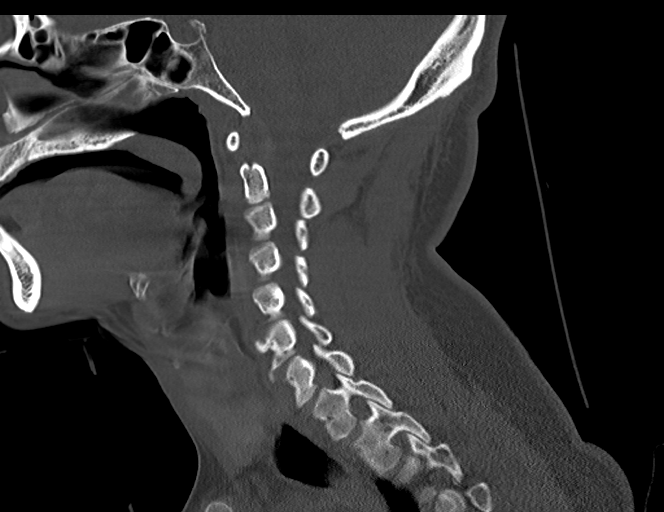
[im 57/86  bone]
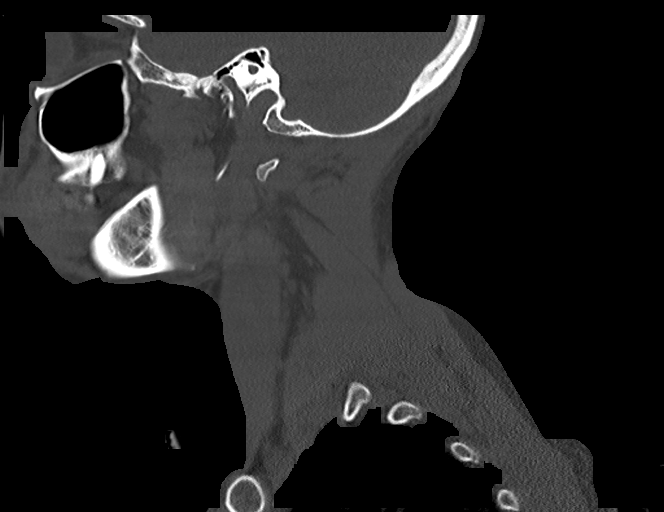
[im 71/86  bone]
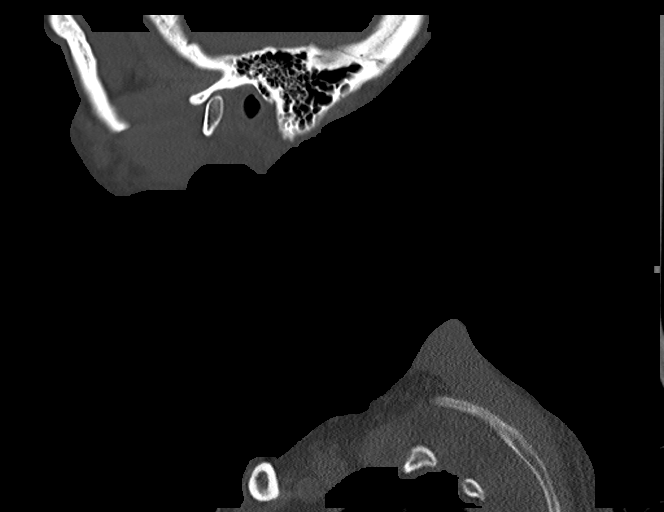

[14 of 33 positions shown; findings below may reference images not displayed]

FINDINGS: CT HEAD FINDINGS

Brain: The brainstem, cerebellum, cerebral peduncles, thalami, basal
ganglia, basilar cisterns, and ventricular system appear within
normal limits. No intracranial hemorrhage, mass lesion, or acute
CVA.

Vascular: Unremarkable

Skull: Old deformity of the right zygomatic arch, stable.

Sinuses/Orbits: Chronic ethmoid and right frontal sinusitis.

Other: No supplemental non-categorized findings.

CT CERVICAL SPINE FINDINGS

Alignment: No vertebral subluxation is observed.

Skull base and vertebrae: No fracture observed. Mild spurring
anterior to the vertebral body column at C5-6 and C6-7.

Soft tissues and spinal canal: Unremarkable

Disc levels:  Unremarkable

Upper chest: Unremarkable

Other: No supplemental non-categorized findings.
IMPRESSION: 1. No acute intracranial findings or acute cervical spine findings.
2. Chronic ethmoid and right frontal sinusitis.

## 2019-01-25 DIAGNOSIS — N3281 Overactive bladder: Secondary | ICD-10-CM | POA: Diagnosis not present

## 2019-01-31 DIAGNOSIS — Z20828 Contact with and (suspected) exposure to other viral communicable diseases: Secondary | ICD-10-CM | POA: Diagnosis not present

## 2019-02-02 ENCOUNTER — Other Ambulatory Visit: Payer: Self-pay

## 2019-02-02 ENCOUNTER — Emergency Department (HOSPITAL_COMMUNITY)
Admission: EM | Admit: 2019-02-02 | Discharge: 2019-02-04 | Disposition: A | Discharge status: Home / Self Care | Payer: Medicare HMO | Attending: Emergency Medicine | Admitting: Emergency Medicine

## 2019-02-02 ENCOUNTER — Encounter (HOSPITAL_COMMUNITY): Payer: Self-pay | Admitting: Emergency Medicine

## 2019-02-02 DIAGNOSIS — I10 Essential (primary) hypertension: Secondary | ICD-10-CM | POA: Insufficient documentation

## 2019-02-02 DIAGNOSIS — R4189 Other symptoms and signs involving cognitive functions and awareness: Secondary | ICD-10-CM | POA: Diagnosis not present

## 2019-02-02 DIAGNOSIS — R7889 Finding of other specified substances, not normally found in blood: Secondary | ICD-10-CM | POA: Diagnosis not present

## 2019-02-02 DIAGNOSIS — F200 Paranoid schizophrenia: Secondary | ICD-10-CM | POA: Insufficient documentation

## 2019-02-02 DIAGNOSIS — F209 Schizophrenia, unspecified: Secondary | ICD-10-CM

## 2019-02-02 DIAGNOSIS — Z20822 Contact with and (suspected) exposure to covid-19: Secondary | ICD-10-CM | POA: Diagnosis not present

## 2019-02-02 DIAGNOSIS — Z03818 Encounter for observation for suspected exposure to other biological agents ruled out: Secondary | ICD-10-CM | POA: Diagnosis not present

## 2019-02-02 DIAGNOSIS — R443 Hallucinations, unspecified: Secondary | ICD-10-CM | POA: Diagnosis not present

## 2019-02-02 DIAGNOSIS — R442 Other hallucinations: Secondary | ICD-10-CM | POA: Diagnosis not present

## 2019-02-02 DIAGNOSIS — R69 Illness, unspecified: Secondary | ICD-10-CM | POA: Diagnosis not present

## 2019-02-02 HISTORY — DX: Hyperglycemia, unspecified: R73.9

## 2019-02-02 HISTORY — DX: Essential (primary) hypertension: I10

## 2019-02-02 HISTORY — DX: Anxiety disorder, unspecified: F41.9

## 2019-02-02 HISTORY — DX: Other symptoms and signs involving cognitive functions and awareness: R41.89

## 2019-02-02 HISTORY — DX: Overactive bladder: N32.81

## 2019-02-02 HISTORY — DX: Epilepsy, unspecified, not intractable, without status epilepticus: G40.909

## 2019-02-02 HISTORY — DX: Gastro-esophageal reflux disease without esophagitis: K21.9

## 2019-02-02 HISTORY — DX: Schizophrenia, unspecified: F20.9

## 2019-02-02 LAB — CBC
HCT: 44 % (ref 39.0–52.0)
Hemoglobin: 14.5 g/dL (ref 13.0–17.0)
MCH: 31.4 pg (ref 26.0–34.0)
MCHC: 33 g/dL (ref 30.0–36.0)
MCV: 95.2 fL (ref 80.0–100.0)
Platelets: 186 10*3/uL (ref 150–400)
RBC: 4.62 MIL/uL (ref 4.22–5.81)
RDW: 13.4 % (ref 11.5–15.5)
WBC: 8.5 10*3/uL (ref 4.0–10.5)
nRBC: 0 % (ref 0.0–0.2)

## 2019-02-02 LAB — ACETAMINOPHEN LEVEL: Acetaminophen (Tylenol), Serum: <10 ug/mL — ABNORMAL LOW (ref 10–30)

## 2019-02-02 LAB — COMPREHENSIVE METABOLIC PANEL
ALT: 11 U/L (ref 0–44)
AST: 15 U/L (ref 15–41)
Albumin: 4.8 g/dL (ref 3.5–5.0)
Alkaline Phosphatase: 62 U/L (ref 38–126)
Anion gap: 10 (ref 5–15)
BUN: 22 mg/dL — ABNORMAL HIGH (ref 6–20)
CO2: 27 mmol/L (ref 22–32)
Calcium: 9.6 mg/dL (ref 8.9–10.3)
Chloride: 103 mmol/L (ref 98–111)
Creatinine, Ser: 0.95 mg/dL (ref 0.61–1.24)
GFR calc Af Amer: >60 mL/min (ref >60)
GFR calc non Af Amer: >60 mL/min (ref >60)
Glucose, Bld: 133 mg/dL — ABNORMAL HIGH (ref 70–99)
Potassium: 3.6 mmol/L (ref 3.5–5.1)
Sodium: 140 mmol/L (ref 135–145)
Total Bilirubin: 0.7 mg/dL (ref 0.3–1.2)
Total Protein: 8.3 g/dL — ABNORMAL HIGH (ref 6.5–8.1)

## 2019-02-02 LAB — RESPIRATORY PANEL BY RT PCR (FLU A&B, COVID)
Influenza A by PCR: NEGATIVE
Influenza B by PCR: NEGATIVE
SARS Coronavirus 2 by RT PCR: NEGATIVE

## 2019-02-02 LAB — SALICYLATE LEVEL: Salicylate Lvl: <7 mg/dL — ABNORMAL LOW (ref 7.0–30.0)

## 2019-02-02 LAB — ETHANOL: Alcohol, Ethyl (B): <10 mg/dL (ref <10)

## 2019-02-02 MED ORDER — LEVETIRACETAM 500 MG PO TABS
500.0000 mg | ORAL_TABLET | Freq: Two times a day (BID) | ORAL | Status: DC
  Administered 2019-02-02: 500 mg via ORAL

## 2019-02-02 MED ORDER — BUSPIRONE HCL 10 MG PO TABS
5.0000 mg | ORAL_TABLET | Freq: Three times a day (TID) | ORAL | Status: DC
  Administered 2019-02-02 – 2019-02-04 (×5): 5 mg via ORAL
  Filled 2019-02-02 (×4): qty 1

## 2019-02-02 MED ORDER — LEVETIRACETAM 500 MG PO TABS
500.0000 mg | ORAL_TABLET | Freq: Two times a day (BID) | ORAL | Status: DC
  Administered 2019-02-02 – 2019-02-04 (×4): 500 mg via ORAL
  Filled 2019-02-02 (×4): qty 1

## 2019-02-02 MED ORDER — OLANZAPINE 5 MG PO TABS
5.0000 mg | ORAL_TABLET | Freq: Once | ORAL | Status: AC
  Administered 2019-02-02: 5 mg via ORAL
  Filled 2019-02-02: qty 1

## 2019-02-02 MED ORDER — DIVALPROEX SODIUM ER 500 MG PO TB24
1000.0000 mg | ORAL_TABLET | Freq: Every day | ORAL | Status: DC
  Administered 2019-02-02 – 2019-02-04 (×3): 1000 mg via ORAL
  Filled 2019-02-02 (×2): qty 2

## 2019-02-02 MED ORDER — HYDROXYZINE HCL 25 MG PO TABS
50.0000 mg | ORAL_TABLET | Freq: Every evening | ORAL | Status: DC | PRN
  Administered 2019-02-03: 50 mg via ORAL
  Filled 2019-02-02: qty 2

## 2019-02-02 MED ORDER — BENZTROPINE MESYLATE 1 MG PO TABS
1.0000 mg | ORAL_TABLET | Freq: Every day | ORAL | Status: DC
  Administered 2019-02-02 – 2019-02-03 (×2): 1 mg via ORAL
  Filled 2019-02-02 (×2): qty 1

## 2019-02-02 MED ORDER — PANTOPRAZOLE SODIUM 40 MG PO TBEC
40.0000 mg | DELAYED_RELEASE_TABLET | Freq: Every day | ORAL | Status: DC
  Administered 2019-02-02 – 2019-02-04 (×3): 40 mg via ORAL
  Filled 2019-02-02: qty 2
  Filled 2019-02-02: qty 1

## 2019-02-02 NOTE — ED Provider Notes (Signed)
[facility]-EM[facility]GENCY DEPT [facility] Emergency Department Provider Note MRN:  [id number]  Arrival date & time: 02/02/19     Chief Complaint   Hallucinations and Abnormal Lab   History of Present Illness   Blake Long. is a 44 y.o. year-old male with a history of paranoid schizophrenia presenting to the ED with chief complaint of hallucinations.  Patient reporting significant worsening of his hallucinations.  They are making him fearful.  He denies pain, denies cough, denies fever.  Facility reports possible abnormal labs recently.  I was unable to obtain an accurate HPI, PMH, or ROS due to the patient's acute psychosis.  Level 5 caveat.  Review of Systems  Positive for hallucinations.  Patient's Health History    Past Medical History:  Diagnosis Date  . Anxiety   . Cognitive impairment   . GERD (gastroesophageal reflux disease)   . Hyperglycemia   . Hypertension   . Overactive bladder   . Paranoid disorder (HCC)   . Schizophrenia (HCC)   . Seizure disorder (HCC)     No past surgical history on file.  No family history on file.  Social History   Socioeconomic History  . Marital status: Single    Spouse name: Not on file  . Number of children: Not on file  . Years of education: Not on file  . Highest education level: Not on file  Occupational History  . Not on file  Tobacco Use  . Smoking status: Never Smoker  Substance and Sexual Activity  . Alcohol use: No  . Drug use: Not on file  . Sexual activity: Not on file  Other Topics Concern  . Not on file  Social History Narrative  . Not on file   Social Determinants of Health   Financial Resource Strain:   . Difficulty of Paying Living Expenses: Not on file  Food Insecurity:   . Worried About Programme researcher, broadcasting/film/video in the Last Year: Not on file  . Ran Out of Food in the Last Year: Not on file  Transportation Needs:   . Lack of Transportation (Medical): Not on file  . Lack of Transportation (Non-Medical):  Not on file  Physical Activity:   . Days of Exercise per Week: Not on file  . Minutes of Exercise per Session: Not on file  Stress:   . Feeling of Stress : Not on file  Social Connections:   . Frequency of Communication with Friends and Family: Not on file  . Frequency of Social Gatherings with Friends and Family: Not on file  . Attends Religious Services: Not on file  . Active Member of Clubs or Organizations: Not on file  . Attends Banker Meetings: Not on file  . Marital Status: Not on file  Intimate Partner Violence:   . Fear of Current or Ex-Partner: Not on file  . Emotionally Abused: Not on file  . Physically Abused: Not on file  . Sexually Abused: Not on file     Physical Exam  Vital Signs and Nursing Notes reviewed Vitals:   02/02/19 1705 02/02/19 1839  BP: (!) 126/96 137/80  Pulse: 87 73  Resp: 16 18  Temp:    SpO2: 97% 100%    CONSTITUTIONAL: Well-appearing, NAD NEURO: Awake, alert, distracted, oriented to name, no focal neurological deficits, normal gait EYES:  eyes equal and reactive ENT/NECK:  no LAD, no JVD CARDIO: Regular rate, well-perfused, normal S1 and S2 PULM:  CTAB no wheezing or rhonchi GI/GU:  normal bowel sounds, non-distended, non-tender MSK/SPINE:  No gross deformities, no edema SKIN:  no rash, atraumatic PSYCH: Disorganized and bizarre speech and behavior  Diagnostic and Interventional Summary    EKG Interpretation  Date/Time:    Ventricular Rate:    PR Interval:    QRS Duration:   QT Interval:    QTC Calculation:   R Axis:     Text Interpretation:        Labs Reviewed  COMPREHENSIVE METABOLIC PANEL - Abnormal; Notable for the following components:      Result Value   Glucose, Bld 133 (*)    BUN 22 (*)    Total Protein 8.3 (*)    All other components within normal limits  SALICYLATE LEVEL - Abnormal; Notable for the following components:   Salicylate Lvl <3.8 (*)    All other components within normal limits    ACETAMINOPHEN LEVEL - Abnormal; Notable for the following components:   Acetaminophen (Tylenol), Serum <10 (*)    All other components within normal limits  RESPIRATORY PANEL BY RT PCR (FLU A&B, COVID)  ETHANOL  CBC  RAPID URINE DRUG SCREEN, HOSP PERFORMED    No orders to display    Medications  OLANZapine (ZYPREXA) tablet 5 mg (has no administration in time range)     Procedures  /  Critical Care .Critical Care Performed by: Maudie Flakes, MD Authorized by: Maudie Flakes, MD   Critical care provider statement:    Critical care time (minutes):  31   Critical care was necessary to treat or prevent imminent or life-threatening deterioration of the following conditions: Acute decompensated paranoid schizophrenia.   Critical care was time spent personally by me on the following activities:  Discussions with consultants, evaluation of patient's response to treatment, examination of patient, ordering and performing treatments and interventions, ordering and review of laboratory studies, ordering and review of radiographic studies, pulse oximetry, re-evaluation of patient's condition, obtaining history from patient or surrogate and review of old charts    ED Course and Medical Decision Making  I have reviewed the triage vital signs, the nursing notes, and pertinent available records from the EMR.  Pertinent labs & imaging results that were available during my care of the patient were reviewed by me and considered in my medical decision making (see below for details).     Unclear if patient has abnormal labs, will need to repeat laboratory assessment here in the emergency department.  He is clearly acutely psychotic, having great difficulty communicating or answering basic questions, was found wandering around the emergency department.  He is fearful about his hallucinations.  Potentially harmful to self or others.  IVC paperwork completed, will await laboratory evaluation and medical  clearance prior to placement in behavioral health holding area.  Medically cleared, awaiting psych placement.  Signed out to default provider.  [name], [place] [email or web address]  Final Clinical Impressions(s) / ED Diagnoses     ICD-10-CM   1. Paranoid schizophrenia ([place])  F20.0     ED Discharge Orders    None       Discharge Instructions Discussed with and Provided to Patient:   Discharge Instructions   None       Maudie Flakes, MD 02/02/19 2026

## 2019-02-02 NOTE — BH Assessment (Addendum)
Assessment Note  Blake Long. is an 44 y.o. male that presents from [place] with increased hallucinations. This writer attempted to contact at [phone] to gather collateral information unsuccessfully. Patient is observed to be highly disorganized and appears to be responding to internal stimuli at the time of assessment. Patient is difficult to redirect and continues to walk out of his room as this Probation officer attempts to assess. Patient keeps pointing through the room window speaking in a low soft voice and seems to be unaware that this writer is present. Patient is unable to participate in the assessment due to current altered mental state. Information to complete assessment was obtained from admission notes and history. Per notes patient was last seen in 2016. Per that event patient was noted to have a history of Schizophrenia and no history of self harm. Patient per that note, received out patient mental health services at [facility]. He has multiple inpatient hospitalizations at [facility] 3x's in the past. Last admission was 2000 for psychotic related symptoms. Patient at that time was at a group home managed by [name] [phone]. It is unclear if patient currently has a guardian or if patient is currently compliant with medication regimen. Notes this date indicate patient has a history of schizophrenia presenting to the ED with chief complaint of hallucinations. Patient receives out patient mental health services at [facility]. Due to altered mental state and patient attempting to leave room this writer notified EDP to evaluate for possible IVC. Bero MD to initiate a IVC. Case was staffed with Bobby Rumpf NP who recommended a inpatient admission.   Diagnosis: F25.0 Schizophrenia   Past Medical History:  Past Medical History:  Diagnosis Date  . Anxiety   . Cognitive impairment   . GERD (gastroesophageal reflux disease)   . Hyperglycemia   . Hypertension   . Overactive bladder   .  Paranoid disorder ([place])   . Schizophrenia ([place])   . Seizure disorder ([place])     No past surgical history on file.  Family History: No family history on file.  Social History:  reports that he has never smoked. He does not have any smokeless tobacco history on file. He reports that he does not drink alcohol. No history on file for drug.  Additional Social History:  Alcohol / Drug Use Pain Medications: See MAR Prescriptions: See MAR Over the Counter: See MAR History of alcohol / drug use?: No history of alcohol / drug abuse  CIWA: CIWA-Ar BP: (!) 126/96 Pulse Rate: 87 COWS:    Allergies: No Known Allergies  Home Medications: (Not in a hospital admission)   OB/GYN Status:  No LMP for male patient.  General Assessment Data Location of Assessment: [facility] TTS Assessment: In system Is this a Tele or Face-to-Face Assessment?: Face-to-Face Is this an Initial Assessment or a Re-assessment for this encounter?: Initial Assessment Patient Accompanied by:: N/A Language Other than English: No Living Arrangements: Other (Comment) What gender do you identify as?: Male Marital status: Single Living Arrangements: Group Home Can pt return to current living arrangement?: Yes Admission Status: Voluntary Is patient capable of signing voluntary admission?: No Referral Source: Self/Family/Friend Insurance type: Medicare  Medical Screening Exam ([place]) Medical Exam completed: Yes  Crisis Care Plan Living Arrangements: Group Home Legal Guardian: ([place]) Name of Psychiatrist: [facility](Per previous event) Name of Therapist: ([facility])  Education Status Is patient currently in school?: No Is the patient employed, unemployed or receiving disability?: Unemployed  Risk to self with the past  6 months Suicidal Ideation: No Has patient been a risk to self within the past 6 months prior to admission? : No Suicidal Intent: No Has patient had any suicidal intent within the past 6 months  prior to admission? : No Is patient at risk for suicide?: No, but patient needs Medical Clearance Suicidal Plan?: No Has patient had any suicidal plan within the past 6 months prior to admission? : No Access to Means: No What has been your use of drugs/alcohol within the last 12 months?: NA Previous Attempts/Gestures: No How many times?: 0 Other Self Harm Risks: (UTA) Triggers for Past Attempts: (NA) Intentional Self Injurious Behavior: None Family Suicide History: No Recent stressful life event(s): (UTA) Persecutory voices/beliefs?: [name]) Depression: (UTA) Depression Symptoms: (UTA) Substance abuse history and/or treatment for substance abuse?: No Suicide prevention information given to non-admitted patients: Not applicable  Risk to Others within the past 6 months Homicidal Ideation: No Does patient have any lifetime risk of violence toward others beyond the six months prior to admission? : No Thoughts of Harm to Others: No Current Homicidal Intent: No Current Homicidal Plan: No Access to Homicidal Means: No Identified Victim: NA History of harm to others?: No Assessment of Violence: None Noted Violent Behavior Description: NA Does patient have access to weapons?: No Criminal Charges Pending?: No Does patient have a court date: No Is patient on probation?: No  Psychosis Hallucinations: Auditory Delusions: None noted  Mental Status Report Appearance/Hygiene: Bizarre Eye Contact: Poor Motor Activity: Freedom of movement Speech: Soft, Slow Level of Consciousness: Quiet/awake Mood: Suspicious Affect: Preoccupied Anxiety Level: None Thought Processes: Thought Blocking Judgement: Impaired Orientation: Unable to assess Obsessive Compulsive Thoughts/Behaviors: Unable to Assess  Cognitive Functioning Concentration: Unable to Assess Memory: Unable to Assess Is patient IDD: No Insight: Unable to Assess Impulse Control: Unable to Assess Appetite: (UTA) Have you had  any weight changes? : (UTA) Sleep: (UTA) Total Hours of Sleep: (UTA) Vegetative Symptoms: (UTA)  ADLScreening [facility] Assessment Services) Patient's cognitive ability adequate to safely complete daily activities?: Yes Patient able to express need for assistance with ADLs?: Yes Independently performs ADLs?: Yes (appropriate for developmental age)  Prior Inpatient Therapy Prior Inpatient Therapy: Yes Prior Therapy Dates: 2018(Per hx) Prior Therapy Facilty/Provider(s): [facility] Reason for Treatment: MH issues  Prior Outpatient Therapy Prior Outpatient Therapy: Yes Prior Therapy Dates: Ongoing Prior Therapy Facilty/Provider(s): [facility] Reason for Treatment: Med mang Does patient have an ACCT team?: No Does patient have Intensive In-House Services?  : No Does patient have [facility] services? : Yes Does patient have P4CC services?: No  ADL Screening (condition at time of admission) Patient's cognitive ability adequate to safely complete daily activities?: Yes Is the patient deaf or have difficulty hearing?: No Does the patient have difficulty seeing, even when wearing glasses/contacts?: No Does the patient have difficulty concentrating, remembering, or making decisions?: Yes Patient able to express need for assistance with ADLs?: Yes Does the patient have difficulty dressing or bathing?: No Independently performs ADLs?: Yes (appropriate for developmental age) Does the patient have difficulty walking or climbing stairs?: No Weakness of Legs: None Weakness of Arms/Hands: None  Home Assistive Devices/Equipment Home Assistive Devices/Equipment: None  Therapy Consults (therapy consults require a physician order) PT Evaluation Needed: No OT Evalulation Needed: No SLP Evaluation Needed: No Abuse/Neglect Assessment (Assessment to be complete while patient is alone) Abuse/Neglect Assessment Can Be Completed: Yes Physical Abuse: Denies Verbal Abuse: Denies Sexual Abuse: Denies Exploitation of  patient/patient's resources: Denies Self-Neglect: Denies Values / Beliefs Cultural Requests  During Hospitalization: None Spiritual Requests During Hospitalization: None Consults Spiritual Care Consult Needed: No Transition of Care Team Consult Needed: No Advance Directives (For Healthcare) Does Patient Have a Medical Advance Directive?: No Would patient like information on creating a medical advance directive?: No - Patient declined          Disposition: Case was staffed with Melvyn Neth NP who recommended a inpatient admission. Disposition Initial Assessment Completed for this Encounter: Yes  On Site Evaluation by:   Reviewed with Physician:    Alfredia Ferguson 02/02/2019 6:10 PM

## 2019-02-02 NOTE — BH Assessment (Signed)
[facility] Assessment Progress Note  Case was staffed with Melvyn Neth NP who recommended a inpatient admission.

## 2019-02-02 NOTE — ED Notes (Signed)
Pt is dressed out in wine colored scrubs

## 2019-02-02 NOTE — ED Notes (Signed)
Patient requesting medication to assist with sleep. Bero MD made aware.

## 2019-02-02 NOTE — ED Notes (Signed)
Pt and belongings transferred to locker and TCU room 30

## 2019-02-02 NOTE — ED Triage Notes (Signed)
Pt presents from [facility] (group home) with c/o abnormal labs, possible low hgb and increasing hallucinations after Covid vaccine 2 days ago. Alert.

## 2019-02-03 DIAGNOSIS — F209 Schizophrenia, unspecified: Secondary | ICD-10-CM

## 2019-02-03 DIAGNOSIS — F200 Paranoid schizophrenia: Secondary | ICD-10-CM | POA: Diagnosis not present

## 2019-02-03 NOTE — ED Notes (Signed)
Pt calm. Cooperative, resting in bed.  Pt endorses hallucinations.

## 2019-02-03 NOTE — ED Notes (Signed)
Pt endorsed hallucinations.

## 2019-02-03 NOTE — ED Notes (Signed)
Called [facility], f/u with placement , was informed no beds available at this time.

## 2019-02-03 NOTE — Consult Note (Addendum)
Telepsych Consultation   Reason for Consult: Hallucinations Referring Physician: [facility] physician Location of Patient: [facility] department [phone] [facility] Provider[facility]  Patient Identification: Blake HomansJerome Seybold Jr. MRN:  [id number] Principal Diagnosis: Schizophrenia (HCC) Diagnosis:  Principal Problem:   Schizophrenia (HCC)   Total Time spent with patient: 30 minutes  Subjective:   Blake HomansJerome Saidi Jr. is a 44 y.o. male patient admitted with increased hallucinations.  Patient assessed by nurse practitioner.  Patient alert and oriented, answers appropriately.  Patient denies suicidal and homicidal ideations.  [facility], denies access to weapons.  Patient reports auditory hallucinations states "the voices are very intense and they say bad insults."  Patient reports insomnia related to auditory hallucinations states "I like to do art work overnight."  Patient reports visual hallucinations intermittently, states "I see things that are not there."  Patient reports compliance with psychiatric medications.  Patient reports he sees psychiatrist unable to verbalize name of psychiatrist, regularly. Patient denies alcohol and substance use. Case discussed with Dr. Jama Flavorsobos.  HPI: Patient admitted with increased hallucinations.  Past Psychiatric History: Schizophrenia, cognitive impairment  Risk to Self: Suicidal Ideation: No Suicidal Intent: No Is patient at risk for suicide?: No, but patient needs Medical Clearance Suicidal Plan?: No Access to Means: No What has been your use of drugs/alcohol within the last 12 months?: NA How many times?: 0 Other Self Harm Risks: (UTA) Triggers for Past Attempts: (NA) Intentional Self Injurious Behavior: None Risk to Others: Homicidal Ideation: No Thoughts of Harm to Others: No Current Homicidal Intent: No Current Homicidal Plan: No Access to Homicidal Means:  No Identified Victim: NA History of harm to others?: No Assessment of Violence: None Noted Violent Behavior Description: NA Does patient have access to weapons?: No Criminal Charges Pending?: No Does patient have a court date: No Prior Inpatient Therapy: Prior Inpatient Therapy: Yes Prior Therapy Dates: 2018(Per hx) Prior Therapy Facilty/Provider(s): [facility] Reason for Treatment: MH issues Prior Outpatient Therapy: Prior Outpatient Therapy: Yes Prior Therapy Dates: Ongoing Prior Therapy Facilty/Provider(s): [facility] Reason for Treatment: Med mang Does patient have an ACCT team?: No Does patient have Intensive In-House Services?  : No Does patient have [facility] services? : Yes Does patient have P4CC services?: No  Past Medical History:  Past Medical History:  Diagnosis Date  . Anxiety   . Cognitive impairment   . GERD (gastroesophageal reflux disease)   . Hyperglycemia   . Hypertension   . Overactive bladder   . Paranoid disorder (HCC)   . Schizophrenia (HCC)   . Seizure disorder (HCC)    No past surgical history on file. Family History: No family history on file. Family Psychiatric  History: Unknown Social History:  Social History   Substance and Sexual Activity  Alcohol Use No     Social History   Substance and Sexual Activity  Drug Use Not on file    Social History   Socioeconomic History  . Marital status: Single    Spouse name: Not on file  . Number of children: Not on file  . Years of education: Not on file  . Highest education level: Not on file  Occupational History  . Not on file  Tobacco Use  . Smoking status: Never Smoker  Substance and Sexual Activity  . Alcohol use: No  . Drug use: Not on file  . Sexual activity: Not on file  Other Topics Concern  . Not on file  Social  History Narrative  . Not on file   Social Determinants of Health   Financial Resource Strain:   . Difficulty of Paying Living Expenses: Not on file  Food Insecurity:   .  Worried About Programme researcher, broadcasting/film/video in the Last Year: Not on file  . Ran Out of Food in the Last Year: Not on file  Transportation Needs:   . Lack of Transportation (Medical): Not on file  . Lack of Transportation (Non-Medical): Not on file  Physical Activity:   . Days of Exercise per Week: Not on file  . Minutes of Exercise per Session: Not on file  Stress:   . Feeling of Stress : Not on file  Social Connections:   . Frequency of Communication with Friends and Family: Not on file  . Frequency of Social Gatherings with Friends and Family: Not on file  . Attends Religious Services: Not on file  . Active Member of Clubs or Organizations: Not on file  . Attends Banker Meetings: Not on file  . Marital Status: Not on file   Additional Social History:    Allergies:  No Known Allergies  Labs:  Results for orders placed or performed during the hospital encounter of 02/02/19 (from the past 48 hour(s))  Comprehensive metabolic panel     Status: Abnormal   Collection Time: 02/02/19  5:05 PM  Result Value Ref Range   Sodium 140 135 - 145 mmol/L   Potassium 3.6 3.5 - 5.1 mmol/L   Chloride 103 98 - 111 mmol/L   CO2 27 22 - 32 mmol/L   Glucose, Bld 133 (H) 70 - 99 mg/dL   BUN 22 (H) 6 - 20 mg/dL   Creatinine, Ser 9.14 0.61 - 1.24 mg/dL   Calcium 9.6 8.9 - 78.2 mg/dL   Total Protein 8.3 (H) 6.5 - 8.1 g/dL   Albumin 4.8 3.5 - 5.0 g/dL   AST 15 15 - 41 U/L   ALT 11 0 - 44 U/L   Alkaline Phosphatase 62 38 - 126 U/L   Total Bilirubin 0.7 0.3 - 1.2 mg/dL   GFR calc non Af Amer >60 >60 mL/min   GFR calc Af Amer >60 >60 mL/min   Anion gap 10 5 - 15    Comment: Performed at [facility], [address] [place], [place] [address]  Ethanol     Status: None   Collection Time: 02/02/19  5:05 PM  Result Value Ref Range   Alcohol, Ethyl (B) <10 <10 mg/dL    Comment: (NOTE) Lowest detectable limit for serum alcohol is 10 mg/dL. For medical purposes  only. Performed at [facility], [address], [place], [place] [address]   Salicylate level     Status: Abnormal   Collection Time: 02/02/19  5:05 PM  Result Value Ref Range   Salicylate Lvl <7.0 (L) 7.0 - 30.0 mg/dL    Comment: Performed at [facility], [address], [place], [place] [address]  Acetaminophen level     Status: Abnormal   Collection Time: 02/02/19  5:05 PM  Result Value Ref Range   Acetaminophen (Tylenol), Serum <10 (L) 10 - 30 ug/mL    Comment: (NOTE) Therapeutic concentrations vary significantly. A range of 10-30 ug/mL  may be an effective concentration for many patients. However, some  are best treated at concentrations outside of this range. Acetaminophen concentrations >150 ug/mL at 4 hours after ingestion  and >50 ug/mL at 12 hours  after ingestion are often associated with  toxic reactions. Performed at [facility], [address], [place], [place] [address]   cbc     Status: None   Collection Time: 02/02/19  5:05 PM  Result Value Ref Range   WBC 8.5 4.0 - 10.5 K/uL   RBC 4.62 4.22 - 5.81 MIL/uL   Hemoglobin 14.5 13.0 - 17.0 g/dL   HCT 54.2 70.6 - 23.7 %   MCV 95.2 80.0 - 100.0 fL   MCH 31.4 26.0 - 34.0 pg   MCHC 33.0 30.0 - 36.0 g/dL   RDW 62.8 31.5 - 17.6 %   Platelets 186 150 - 400 K/uL   nRBC 0.0 0.0 - 0.2 %    Comment: Performed at [facility], [address] [place], [place] [address]  Respiratory Panel by RT PCR (Flu A&B, Covid) - Nasopharyngeal Swab     Status: None   Collection Time: 02/02/19  5:05 PM   Specimen: Nasopharyngeal Swab  Result Value Ref Range   SARS Coronavirus 2 by RT PCR NEGATIVE NEGATIVE    Comment: (NOTE) SARS-CoV-2 target nucleic acids are NOT DETECTED. The SARS-CoV-2 RNA is generally detectable in upper respiratoy specimens during the acute phase of infection. The lowest concentration of SARS-CoV-2 viral copies this assay can detect  is 131 copies/mL. A negative result does not preclude SARS-Cov-2 infection and should not be used as the sole basis for treatment or other patient management decisions. A negative result may occur with  improper specimen collection/handling, submission of specimen other than nasopharyngeal swab, presence of viral mutation(s) within the areas targeted by this assay, and inadequate number of viral copies (<131 copies/mL). A negative result must be combined with clinical observations, patient history, and epidemiological information. The expected result is Negative. Fact Sheet for Patients:  https://www.moore.com/ Fact Sheet for Healthcare Providers:  https://www.young.biz/ This test is not yet ap proved or cleared by the [place] FDA and  has been authorized for detection and/or diagnosis of SARS-CoV-2 by FDA under an Emergency Use Authorization (EUA). This EUA will remain  in effect (meaning this test can be used) for the duration of the COVID-19 declaration under Section 564(b)(1) of the Act, 21 U.S.C. section 360bbb-3(b)(1), unless the authorization is terminated or revoked sooner.    Influenza A by PCR NEGATIVE NEGATIVE   Influenza B by PCR NEGATIVE NEGATIVE    Comment: (NOTE) The Xpert Xpress SARS-CoV-2/FLU/RSV assay is intended as an aid in  the diagnosis of influenza from Nasopharyngeal swab specimens and  should not be used as a sole basis for treatment. Nasal washings and  aspirates are unacceptable for Xpert Xpress SARS-CoV-2/FLU/RSV  testing. Fact Sheet for Patients: https://www.moore.com/ Fact Sheet for Healthcare Providers: https://www.young.biz/ This test is not yet approved or cleared by the [place] FDA and  has been authorized for detection and/or diagnosis of SARS-CoV-2 by  FDA under an Emergency Use Authorization (EUA). This EUA will remain  in effect (meaning this test can be  used) for the duration of the  Covid-19 declaration under Section 564(b)(1) of the Act, 21  U.S.C. section 360bbb-3(b)(1), unless the authorization is  terminated or revoked. Performed at [facility], [address] [place], [place] [address]     Medications:  Current Facility-Administered Medications  Medication Dose Route Frequency Provider Last Rate Last Admin  . benztropine (COGENTIN) tablet 1 mg  1 mg Oral QHS Sabas Sous, MD   1 mg at 02/02/19 2136  .  busPIRone (BUSPAR) tablet 5 mg  5 mg Oral TID Maudie Flakes, MD   5 mg at 02/03/19 0941  . divalproex (DEPAKOTE ER) 24 hr tablet 1,000 mg  1,000 mg Oral Daily Maudie Flakes, MD   1,000 mg at 02/03/19 0941  . hydrOXYzine (ATARAX/VISTARIL) tablet 50 mg  50 mg Oral QHS PRN Maudie Flakes, MD      . levETIRAcetam (KEPPRA) tablet 500 mg  500 mg Oral BID Maudie Flakes, MD   500 mg at 02/03/19 0941  . pantoprazole (PROTONIX) EC tablet 40 mg  40 mg Oral Daily Maudie Flakes, MD   40 mg at 02/03/19 6734   Current Outpatient Medications  Medication Sig Dispense Refill  . benztropine (COGENTIN) 1 MG tablet Take 1 mg by mouth at bedtime.     . busPIRone (BUSPAR) 5 MG tablet Take 5 mg by mouth 3 (three) times daily.    . clonazePAM (KLONOPIN) 1 MG tablet Take 1 mg by mouth 2 (two) times daily.    . divalproex (DEPAKOTE ER) 500 MG 24 hr tablet Take 1,000 mg by mouth at bedtime.     . levETIRAcetam (KEPPRA) 500 MG tablet Take 1 tablet (500 mg total) by mouth 2 (two) times daily. 60 tablet 0    Musculoskeletal: Strength & Muscle Tone: within normal limits Gait & Station: normal Patient leans: N/A  Psychiatric Specialty Exam: Physical Exam  Nursing note and vitals reviewed. Constitutional: He is oriented to person, place, and time. He appears well-developed.  HENT:  Head: Normocephalic.  Cardiovascular: Normal rate.  Respiratory: Effort normal.  Neurological: He is alert and oriented to person, place, and  time.  Psychiatric: His speech is normal and behavior is normal. Judgment normal. His mood appears anxious. Thought content is delusional. Cognition and memory are normal.    Review of Systems  Constitutional: Negative.   HENT: Negative.   Eyes: Negative.   Respiratory: Negative.   Cardiovascular: Negative.   Gastrointestinal: Negative.   Genitourinary: Negative.   Musculoskeletal: Negative.   Skin: Negative.   Neurological: Negative.   Psychiatric/Behavioral: Positive for sleep disturbance.    Blood pressure 137/81, pulse 83, temperature 99.9 F (37.7 C), temperature source Oral, resp. rate 18, SpO2 100 %.There is no height or weight on file to calculate BMI.  General Appearance: Casual  Eye Contact:  Fair  Speech:  Clear and Coherent and Normal Rate  Volume:  Normal  Mood:  Anxious  Affect:  Appropriate and Congruent  Thought Process:  Coherent, Goal Directed and Descriptions of Associations: Tangential  Orientation:  Full (Time, Place, and Person)  Thought Content:  Hallucinations: Auditory Visual  Suicidal Thoughts:  No  Homicidal Thoughts:  No  Memory:  Immediate;   Good Recent;   Good Remote;   Good  Judgement:  Fair  Insight:  Fair  Psychomotor Activity:  Normal  Concentration:  Concentration: Fair and Attention Span: Fair  Recall:  Good  Fund of Knowledge:  Fair  Language:  Fair  Akathisia:  No  Handed:  Right  AIMS (if indicated):     Assets:  Communication Skills Desire for Improvement Financial Resources/Insurance Housing Leisure Time Physical Health Resilience Social Support  ADL's:  Intact  Cognition:  WNL  Sleep:        Treatment Plan Summary: Daily contact with patient to assess and evaluate symptoms and progress in treatment  Disposition: Recommend psychiatric Inpatient admission when medically cleared. Supportive therapy provided about ongoing stressors.  This  service was provided via telemedicine using a 2-way, interactive audio and  Immunologist.  Names of all persons participating in this telemedicine service and their role in this encounter. Name: [facility] Role: Patient  Name: Berneice Heinrich Role: FNP    Patrcia Dolly, FNP 02/03/2019 10:30 AM   Attest to NP Note

## 2019-02-03 NOTE — Progress Notes (Signed)
Patient meets criteria for inpatient treatment. No appropriate or available beds at [facility]. CSW faxed referrals to the following facilities for review:  [facility]   TTS will continue to seek bed placement.  Vilma Meckel. Algis Greenhouse, MSW, LCSW Clinical Social Work/Disposition Phone: [phone] Fax: [phone]

## 2019-02-04 ENCOUNTER — Encounter (HOSPITAL_COMMUNITY): Payer: Self-pay | Admitting: Registered Nurse

## 2019-02-04 DIAGNOSIS — E559 Vitamin D deficiency, unspecified: Secondary | ICD-10-CM | POA: Diagnosis not present

## 2019-02-04 DIAGNOSIS — R4189 Other symptoms and signs involving cognitive functions and awareness: Secondary | ICD-10-CM | POA: Diagnosis not present

## 2019-02-04 DIAGNOSIS — J309 Allergic rhinitis, unspecified: Secondary | ICD-10-CM | POA: Diagnosis not present

## 2019-02-04 DIAGNOSIS — N3281 Overactive bladder: Secondary | ICD-10-CM | POA: Diagnosis not present

## 2019-02-04 DIAGNOSIS — F200 Paranoid schizophrenia: Secondary | ICD-10-CM

## 2019-02-04 DIAGNOSIS — K219 Gastro-esophageal reflux disease without esophagitis: Secondary | ICD-10-CM | POA: Diagnosis not present

## 2019-02-04 DIAGNOSIS — R69 Illness, unspecified: Secondary | ICD-10-CM | POA: Diagnosis not present

## 2019-02-04 DIAGNOSIS — E039 Hypothyroidism, unspecified: Secondary | ICD-10-CM | POA: Diagnosis not present

## 2019-02-04 DIAGNOSIS — I10 Essential (primary) hypertension: Secondary | ICD-10-CM | POA: Diagnosis not present

## 2019-02-04 DIAGNOSIS — F411 Generalized anxiety disorder: Secondary | ICD-10-CM | POA: Diagnosis not present

## 2019-02-04 DIAGNOSIS — G4089 Other seizures: Secondary | ICD-10-CM | POA: Diagnosis not present

## 2019-02-04 DIAGNOSIS — G3184 Mild cognitive impairment, so stated: Secondary | ICD-10-CM | POA: Diagnosis not present

## 2019-02-04 LAB — VALPROIC ACID LEVEL: Valproic Acid Lvl: 105 ug/mL — ABNORMAL HIGH (ref 50.0–100.0)

## 2019-02-04 MED ORDER — OMEPRAZOLE 20 MG PO CPDR: 40.0000 mg | DELAYED_RELEASE_CAPSULE | Freq: Every day | ORAL | 0 refills | Status: AC

## 2019-02-04 NOTE — BH Assessment (Signed)
[facility] Assessment Progress Note This Clinical research associate spoke with Caroline More at [facility] [phone] to inform her patient has been cleared by psychiatry and is ready to discharge. Janey Greaser states they will provide transportation when patient is ready to be discharged. This Clinical research associate also contacted patient's mother Alver Leete [phone] to inform of disposition.

## 2019-02-04 NOTE — ED Notes (Signed)
Pt discharged home. Discharged instructions read to pt who verbalized understanding. All belongings returned to pt. Denies SI/HI, is not delusional and not responding to internal stimuli. Escorted pt to the ED exit to a representative of his group home. Discharge instructions were given to care giver.

## 2019-02-04 NOTE — Consult Note (Addendum)
[facility] Psych ED Discharge  02/04/2019 10:49 AM Blake Long.  MRN:  [id number] Principal Problem: Schizophrenia [name] Discharge Diagnoses: Principal Problem:   Schizophrenia (HCC) Active Problems:   Cognitive impairment   Subjective: Blake Long., 44 y.o., male patient seen via tele psych by this provider, Dr. Lucianne Muss; and chart reviewed on 02/04/19.  On evaluation Blake Long. reports patient was brought to the hospital on Saturday after receiving a Covid vaccine injection and had a reaction.  Today patient reports he is feeling better.  When asked if he was hearing any voices patient responded " no.  I hear voices when they open the door to my room.  I don't hear voices in my head."  At this time patient is denying suicidal/self-harm/homicidal ideations, psychosis, paranoia.  Patient currently resides in a group home.  Patient appears to be at his baseline according to staff who is familiar with patient.  Patient is also been compliant with medications while in group home.  TTS will contact group home staff the patient will be discharged today During evaluation Blake Long. is alert/oriented x 4; calm/cooperative; and mood is congruent with affect.  He does not appear to be responding to internal/external stimuli or delusional thoughts.  Patient denies suicidal/self-harm/homicidal ideation, psychosis, and paranoia.  Patient answered question appropriately to the best of his ability.  Possibly some intellectual disorder.   Total Time spent with patient: 30 minutes  Past Psychiatric History: See below   Past Medical History:  Past Medical History:  Diagnosis Date  . Anxiety   . Cognitive impairment   . GERD (gastroesophageal reflux disease)   . Hyperglycemia   . Hypertension   . Overactive bladder   . Paranoid disorder (HCC)   . Schizophrenia (HCC)   . Seizure disorder [facility])    History reviewed. No pertinent surgical history. Family History: History reviewed. No  pertinent family history. Family Psychiatric  History: Unaware Social History:  Social History   Substance and Sexual Activity  Alcohol Use No     Social History   Substance and Sexual Activity  Drug Use Not on file    Social History   Socioeconomic History  . Marital status: Single    Spouse name: Not on file  . Number of children: Not on file  . Years of education: Not on file  . Highest education level: Not on file  Occupational History  . Not on file  Tobacco Use  . Smoking status: Never Smoker  Substance and Sexual Activity  . Alcohol use: No  . Drug use: Not on file  . Sexual activity: Not on file  Other Topics Concern  . Not on file  Social History Narrative  . Not on file   Social Determinants of Health   Financial Resource Strain:   . Difficulty of Paying Living Expenses: Not on file  Food Insecurity:   . Worried About Programme researcher, broadcasting/film/video in the Last Year: Not on file  . Ran Out of Food in the Last Year: Not on file  Transportation Needs:   . Lack of Transportation (Medical): Not on file  . Lack of Transportation (Non-Medical): Not on file  Physical Activity:   . Days of Exercise per Week: Not on file  . Minutes of Exercise per Session: Not on file  Stress:   . Feeling of Stress : Not on file  Social Connections:   . Frequency of Communication with Friends and Family: Not on file  .  Frequency of Social Gatherings with Friends and Family: Not on file  . Attends Religious Services: Not on file  . Active Member of Clubs or Organizations: Not on file  . Attends Banker Meetings: Not on file  . Marital Status: Not on file    Has this patient used any form of tobacco in the last 30 days? (Cigarettes, Smokeless Tobacco, Cigars, and/or Pipes) Prescription not provided because: Patient does not use tobacco products  Current Medications: Current Facility-Administered Medications  Medication Dose Route Frequency Provider Last Rate Last Admin   . benztropine (COGENTIN) tablet 1 mg  1 mg Oral QHS Sabas Sous, MD   1 mg at 02/03/19 2122  . busPIRone (BUSPAR) tablet 5 mg  5 mg Oral TID Sabas Sous, MD   5 mg at 02/04/19 1015  . divalproex (DEPAKOTE ER) 24 hr tablet 1,000 mg  1,000 mg Oral Daily Sabas Sous, MD   1,000 mg at 02/04/19 1015  . hydrOXYzine (ATARAX/VISTARIL) tablet 50 mg  50 mg Oral QHS PRN Sabas Sous, MD   50 mg at 02/03/19 2121  . levETIRAcetam (KEPPRA) tablet 500 mg  500 mg Oral BID Sabas Sous, MD   500 mg at 02/04/19 1014  . pantoprazole (PROTONIX) EC tablet 40 mg  40 mg Oral Daily Sabas Sous, MD   40 mg at 02/04/19 1014   Current Outpatient Medications  Medication Sig Dispense Refill  . benztropine (COGENTIN) 1 MG tablet Take 1 mg by mouth at bedtime.     . busPIRone (BUSPAR) 5 MG tablet Take 5 mg by mouth 3 (three) times daily.    . clonazePAM (KLONOPIN) 1 MG tablet Take 1 mg by mouth 2 (two) times daily.    . divalproex (DEPAKOTE ER) 500 MG 24 hr tablet Take 1,000 mg by mouth at bedtime.     . levETIRAcetam (KEPPRA) 500 MG tablet Take 1 tablet (500 mg total) by mouth 2 (two) times daily. 60 tablet 0  . omeprazole (PRILOSEC) 20 MG capsule Take 2 capsules (40 mg total) by mouth daily. 30 capsule 0   PTA Medications: (Not in a hospital admission)   Musculoskeletal: Strength & Muscle Tone: within normal limits Gait & Station: normal Patient leans: N/A  Psychiatric Specialty Exam: Physical Exam Vitals and nursing note reviewed.  Constitutional:      Appearance: Normal appearance.  Pulmonary:     Effort: Pulmonary effort is normal.  Neurological:     Mental Status: He is alert.  Psychiatric:        Attention and Perception: Attention and perception normal.        Mood and Affect: Mood and affect normal.        Speech: Speech is delayed.        Thought Content: Thought content is not paranoid or delusional. Thought content does not include homicidal or suicidal ideation.         Judgment: Judgment normal.     Review of Systems  Psychiatric/Behavioral: Negative for agitation, confusion, hallucinations, self-injury, sleep disturbance and suicidal ideas. The patient is not nervous/anxious.   All other systems reviewed and are negative.   Blood pressure 123/84, pulse 80, temperature 98 F (36.7 C), temperature source Oral, resp. rate 18, SpO2 98 %.There is no height or weight on file to calculate BMI.  General Appearance: Casual  Eye Contact:  Good  Speech:  Clear and Coherent and Normal Rate  Volume:  Normal  Mood:  "  Good."  Appropriate  Affect:  Appropriate and Congruent  Thought Process:  Coherent, Goal Directed and Linear  Orientation:  Full (Time, Place, and Person)  Thought Content:  WDL  Suicidal Thoughts:  No  Homicidal Thoughts:  No  Memory:  Immediate;   Fair Recent;   Fair  Judgement:  Intact  Insight:  Present  Psychomotor Activity:  Normal  Concentration:  Concentration: Fair and Attention Span: Fair  Recall:  [employer] of Knowledge:  Fair  Language:  Fair  Akathisia:  No  Handed:  Right  AIMS (if indicated):     Assets:  Communication Skills Desire for Improvement Housing Social Support  ADL's:  Intact  Cognition:  WNL  Sleep:        Demographic Factors:  Male  Loss Factors: NA  Historical Factors: NA  Risk Reduction Factors:   Religious beliefs about death, Living with another person, especially a relative, Positive social support and Positive therapeutic relationship  Continued Clinical Symptoms:  Previous Psychiatric Diagnoses and Treatments  Cognitive Features That Contribute To Risk:  Loss of executive function    Suicide Risk:  Minimal: No identifiable suicidal ideation.  Patients presenting with no risk factors but with morbid ruminations; may be classified as minimal risk based on the severity of the depressive symptoms    Plan Of Care/Follow-up recommendations:  Activity:  As tolerated Diet:  Heart  healthy    Discharge Instructions     For your behavioral health needs, you are advised to continue treatment with your current outpatient provider.    Disposition: Patient psychiatrically cleared No evidence of imminent risk to self or others at present.   Patient does not meet criteria for psychiatric inpatient admission. Supportive therapy provided about ongoing stressors. Discussed crisis plan, support from social network, calling 911, coming to the Emergency Department, and calling Suicide Hotline.  Jahlen Bollman, NP 02/04/2019, 10:49 AM

## 2019-02-04 NOTE — BH Assessment (Signed)
BHH Assessment Progress Note  Per Shuvon Rankin, FNP, this pt does not require psychiatric hospitalization at this time.  Pt presents under IVC initiated by EDP Kennis Carina, MD, which has been rescinded by Nelly Rout, MD.  Pt is to be discharged from [facility] to return to his group home and to follow up with his current outpatient provider.  This has been included in pt's discharge instructions.  Pt's nurse, Diane, has been notified.  Doylene Canning, MA Triage Specialist [phone]

## 2019-02-04 NOTE — Discharge Instructions (Signed)
For your behavioral health needs, you are advised to continue treatment with your current outpatient provider. °

## 2019-02-04 NOTE — ED Notes (Signed)
Pt reports that birthday year is [birth year].  It is on the chart 1997, but his medicaid paperwork reflects 1977 as well.

## 2019-02-04 NOTE — ED Notes (Signed)
Called [facility] and they will be here to pick pt up in 30 minutes.

## 2019-02-08 ENCOUNTER — Other Ambulatory Visit: Payer: Self-pay

## 2019-02-08 ENCOUNTER — Encounter (HOSPITAL_COMMUNITY): Payer: Self-pay | Admitting: Emergency Medicine

## 2019-02-08 ENCOUNTER — Emergency Department (HOSPITAL_COMMUNITY)
Admission: EM | Admit: 2019-02-08 | Discharge: 2019-02-11 | Disposition: A | Discharge status: Home / Self Care | Payer: Medicare HMO | Attending: Emergency Medicine | Admitting: Emergency Medicine

## 2019-02-08 DIAGNOSIS — R45851 Suicidal ideations: Secondary | ICD-10-CM | POA: Diagnosis not present

## 2019-02-08 DIAGNOSIS — R4189 Other symptoms and signs involving cognitive functions and awareness: Secondary | ICD-10-CM | POA: Diagnosis not present

## 2019-02-08 DIAGNOSIS — Z20822 Contact with and (suspected) exposure to covid-19: Secondary | ICD-10-CM | POA: Insufficient documentation

## 2019-02-08 DIAGNOSIS — R440 Auditory hallucinations: Secondary | ICD-10-CM | POA: Diagnosis not present

## 2019-02-08 DIAGNOSIS — R569 Unspecified convulsions: Secondary | ICD-10-CM

## 2019-02-08 DIAGNOSIS — Z03818 Encounter for observation for suspected exposure to other biological agents ruled out: Secondary | ICD-10-CM | POA: Diagnosis not present

## 2019-02-08 DIAGNOSIS — F250 Schizoaffective disorder, bipolar type: Secondary | ICD-10-CM | POA: Diagnosis not present

## 2019-02-08 DIAGNOSIS — I10 Essential (primary) hypertension: Secondary | ICD-10-CM | POA: Insufficient documentation

## 2019-02-08 DIAGNOSIS — Z79899 Other long term (current) drug therapy: Secondary | ICD-10-CM | POA: Diagnosis not present

## 2019-02-08 DIAGNOSIS — F209 Schizophrenia, unspecified: Secondary | ICD-10-CM | POA: Diagnosis not present

## 2019-02-08 DIAGNOSIS — R69 Illness, unspecified: Secondary | ICD-10-CM | POA: Diagnosis not present

## 2019-02-08 DIAGNOSIS — R4182 Altered mental status, unspecified: Secondary | ICD-10-CM | POA: Diagnosis not present

## 2019-02-08 DIAGNOSIS — R442 Other hallucinations: Secondary | ICD-10-CM | POA: Diagnosis not present

## 2019-02-08 LAB — CBC WITH DIFFERENTIAL/PLATELET
Abs Immature Granulocytes: 0.03 10*3/uL (ref 0.00–0.07)
Basophils Absolute: 0 10*3/uL (ref 0.0–0.1)
Basophils Relative: 0 %
Eosinophils Absolute: 0 10*3/uL (ref 0.0–0.5)
Eosinophils Relative: 0 %
HCT: 42.8 % (ref 39.0–52.0)
Hemoglobin: 14.2 g/dL (ref 13.0–17.0)
Immature Granulocytes: 0 %
Lymphocytes Relative: 26 %
Lymphs Abs: 1.8 10*3/uL (ref 0.7–4.0)
MCH: 31.1 pg (ref 26.0–34.0)
MCHC: 33.2 g/dL (ref 30.0–36.0)
MCV: 93.7 fL (ref 80.0–100.0)
Monocytes Absolute: 0.4 10*3/uL (ref 0.1–1.0)
Monocytes Relative: 6 %
Neutro Abs: 4.7 10*3/uL (ref 1.7–7.7)
Neutrophils Relative %: 68 %
Platelets: 212 10*3/uL (ref 150–400)
RBC: 4.57 MIL/uL (ref 4.22–5.81)
RDW: 13.3 % (ref 11.5–15.5)
WBC: 7.1 10*3/uL (ref 4.0–10.5)
nRBC: 0 % (ref 0.0–0.2)

## 2019-02-08 LAB — RAPID URINE DRUG SCREEN, HOSP PERFORMED
Amphetamines: NOT DETECTED
Barbiturates: NOT DETECTED
Benzodiazepines: POSITIVE — AB
Cocaine: NOT DETECTED
Opiates: NOT DETECTED
Tetrahydrocannabinol: NOT DETECTED

## 2019-02-08 LAB — BASIC METABOLIC PANEL
Anion gap: 11 (ref 5–15)
BUN: 22 mg/dL — ABNORMAL HIGH (ref 6–20)
CO2: 25 mmol/L (ref 22–32)
Calcium: 9.8 mg/dL (ref 8.9–10.3)
Chloride: 103 mmol/L (ref 98–111)
Creatinine, Ser: 0.9 mg/dL (ref 0.61–1.24)
GFR calc Af Amer: >60 mL/min (ref >60)
GFR calc non Af Amer: >60 mL/min (ref >60)
Glucose, Bld: 104 mg/dL — ABNORMAL HIGH (ref 70–99)
Potassium: 3.9 mmol/L (ref 3.5–5.1)
Sodium: 139 mmol/L (ref 135–145)

## 2019-02-08 LAB — ETHANOL: Alcohol, Ethyl (B): <10 mg/dL (ref <10)

## 2019-02-08 MED ORDER — CLOZAPINE 100 MG PO TABS
500.0000 mg | ORAL_TABLET | Freq: Every day | ORAL | Status: DC
  Administered 2019-02-09: 500 mg via ORAL
  Filled 2019-02-08 (×2): qty 5

## 2019-02-08 MED ORDER — BENZTROPINE MESYLATE 1 MG PO TABS
2.0000 mg | ORAL_TABLET | Freq: Every day | ORAL | Status: DC
  Administered 2019-02-09: 2 mg via ORAL
  Filled 2019-02-08: qty 2

## 2019-02-08 MED ORDER — BUSPIRONE HCL 10 MG PO TABS
5.0000 mg | ORAL_TABLET | Freq: Three times a day (TID) | ORAL | Status: DC
  Administered 2019-02-09 – 2019-02-11 (×8): 5 mg via ORAL
  Filled 2019-02-08 (×8): qty 1

## 2019-02-08 MED ORDER — DOCUSATE SODIUM 100 MG PO CAPS: 100.0000 mg | ORAL_CAPSULE | Freq: Every day | ORAL | Status: DC | PRN

## 2019-02-08 MED ORDER — PANTOPRAZOLE SODIUM 40 MG PO TBEC
40.0000 mg | DELAYED_RELEASE_TABLET | Freq: Every day | ORAL | Status: DC
  Administered 2019-02-09 – 2019-02-11 (×3): 40 mg via ORAL
  Filled 2019-02-08 (×3): qty 1

## 2019-02-08 MED ORDER — LEVETIRACETAM 500 MG PO TABS
500.0000 mg | ORAL_TABLET | Freq: Two times a day (BID) | ORAL | Status: DC
  Administered 2019-02-09 – 2019-02-11 (×6): 500 mg via ORAL
  Filled 2019-02-08 (×6): qty 1

## 2019-02-08 MED ORDER — HYDROXYZINE HCL 25 MG PO TABS: 25.0000 mg | ORAL_TABLET | Freq: Four times a day (QID) | ORAL | Status: DC | PRN

## 2019-02-08 MED ORDER — LORATADINE 10 MG PO TABS: 10.0000 mg | ORAL_TABLET | Freq: Every day | ORAL | Status: DC | PRN

## 2019-02-08 MED ORDER — LORAZEPAM 1 MG PO TABS
1.0000 mg | ORAL_TABLET | Freq: Once | ORAL | Status: AC
  Administered 2019-02-08: 1 mg via ORAL
  Filled 2019-02-08: qty 1

## 2019-02-08 MED ORDER — CLOZAPINE 100 MG PO TABS
400.0000 mg | ORAL_TABLET | Freq: Every day | ORAL | Status: DC
  Administered 2019-02-09: 400 mg via ORAL
  Filled 2019-02-08: qty 4

## 2019-02-08 MED ORDER — DIVALPROEX SODIUM ER 500 MG PO TB24
1000.0000 mg | ORAL_TABLET | Freq: Every day | ORAL | Status: DC
  Administered 2019-02-09 – 2019-02-10 (×3): 1000 mg via ORAL
  Filled 2019-02-08 (×3): qty 2

## 2019-02-08 NOTE — ED Notes (Signed)
Pt ambulatory to restroom without concern  

## 2019-02-08 NOTE — ED Notes (Signed)
TTS cart at bedside. 

## 2019-02-08 NOTE — ED Triage Notes (Signed)
Per [facility] pt was picked up at a hair saloon for unresponsiveness. Pt reports having hearing voices that tell him to harm himself. Voices x 2 days. Pt resides at a group home but was by himself at the shop.

## 2019-02-08 NOTE — ED Notes (Signed)
Pt not able to provide enough urine for specimen at this time.

## 2019-02-08 NOTE — ED Notes (Signed)
Blake Long [phone], care giver wants an update on this patient.

## 2019-02-08 NOTE — ED Provider Notes (Addendum)
Blake Long DEPT Provider Note   CSN: [id number] Arrival date & time: 02/08/19  1353     History Chief Complaint  Patient presents with  . Hallucinations  . Suicidal    Blake Long. is a 44 y.o. male.  Level 5 caveat for cognitive impairment, schizophrenia.  Chief complaint hearing voices that tell him to harm himself.  He apparently was picked up by EMS and a hair salon and was "unresponsive".  He is ambulatory in the ED and able to talk.        Past Medical History:  Diagnosis Date  . Anxiety   . Cognitive impairment   . GERD (gastroesophageal reflux disease)   . Hyperglycemia   . Hypertension   . Overactive bladder   . Paranoid disorder ([place])   . Schizophrenia ([place])   . Seizure disorder [facility])     Patient Active Problem List   Diagnosis Date Noted  . Cognitive impairment   . Schizophrenia ([place]) 02/03/2019    History reviewed. No pertinent surgical history.     No family history on file.  Social History   Tobacco Use  . Smoking status: Never Smoker  Substance Use Topics  . Alcohol use: No  . Drug use: Not on file    Home Medications Prior to Admission medications   Medication Sig Start Date End Date Taking? Authorizing Provider  benztropine (COGENTIN) 1 MG tablet Take 2 mg by mouth at bedtime.    Yes [provider]  busPIRone (BUSPAR) 5 MG tablet Take 5 mg by mouth 3 (three) times daily.   Yes [provider]  cloZAPine (CLOZARIL) 100 MG tablet Take 400-500 mg by mouth 2 (two) times daily. Takes four tablets in the morning and 5 tablets at night.   Yes [provider]  divalproex (DEPAKOTE ER) 500 MG 24 hr tablet Take 1,000 mg by mouth at bedtime.    Yes [provider]  docusate sodium (COLACE) 100 MG capsule Take 100 mg by mouth daily as needed for mild constipation.   Yes [provider]  hydrOXYzine (ATARAX/VISTARIL) 25 MG tablet Take 25 mg by mouth 4 (four) times  daily as needed for anxiety.   Yes [provider]  levETIRAcetam (KEPPRA) 500 MG tablet Take 1 tablet (500 mg total) by mouth 2 (two) times daily. 01/14/18 02/08/19 Yes Tacy Learn, PA-C  loratadine (CLARITIN) 10 MG tablet Take 10 mg by mouth daily as needed for allergies.   Yes [provider]  omeprazole (PRILOSEC) 20 MG capsule Take 2 capsules (40 mg total) by mouth daily. Patient not taking: Reported on 02/08/2019 02/04/19   Rankin, Delphia Grates B, NP    Allergies    Patient has no known allergies.  Review of Systems   Review of Systems  Unable to perform ROS: Psychiatric disorder    Physical Exam Updated Vital Signs BP 117/77 (BP Location: Left Arm)   Pulse 62   Temp 98.4 F (36.9 C) (Oral)   Resp 16   SpO2 100%   Physical Exam Vitals and nursing note reviewed.  Constitutional:      Appearance: He is well-developed.  HENT:     Head: Normocephalic and atraumatic.  Eyes:     Conjunctiva/sclera: Conjunctivae normal.  Cardiovascular:     Rate and Rhythm: Normal rate and regular rhythm.  Pulmonary:     Effort: Pulmonary effort is normal.     Breath sounds: Normal breath sounds.  Abdominal:  General: Bowel sounds are normal.     Palpations: Abdomen is soft.  Musculoskeletal:        General: Normal range of motion.     Cervical back: Neck supple.  Skin:    General: Skin is warm and dry.  Neurological:     General: No focal deficit present.     Mental Status: He is alert and oriented to person, place, and time.  Psychiatric:     Comments: Flight of ideas, auditory hallucinations     ED Results / Procedures / Treatments   Labs (all labs ordered are listed, but only abnormal results are displayed) Labs Reviewed  BASIC METABOLIC PANEL - Abnormal; Notable for the following components:      Result Value   Glucose, Bld 104 (*)    BUN 22 (*)    All other components within normal limits  RAPID URINE DRUG SCREEN, HOSP PERFORMED - Abnormal; Notable  for the following components:   Benzodiazepines POSITIVE (*)    All other components within normal limits  CBC WITH DIFFERENTIAL/PLATELET  ETHANOL    EKG None  Radiology No results found.  Procedures Procedures (including critical care time)  Medications Ordered in ED Medications  LORazepam (ATIVAN) tablet 1 mg (1 mg Oral Given 02/08/19 1732)    ED Course  I have reviewed the triage vital signs and the nursing notes.  Pertinent labs & imaging results that were available during my care of the patient were reviewed by me and considered in my medical decision making (see chart for details).    MDM Rules/Calculators/A&P                      Patient has known psychiatric diagnoses.  He apparently was found unresponsive earlier today.  He is now conversant and ambulatory; however, he appears to be hallucinating.  Will obtain TTS consult. Final Clinical Impression(s) / ED Diagnoses Final diagnoses:  Auditory hallucination  Schizophrenia, unspecified type [facility])  Cognitive impairment    Rx / DC Orders ED Discharge Orders    None       Donnetta Hutching, MD 02/08/19 1702    Donnetta Hutching, MD 02/08/19 2138

## 2019-02-08 NOTE — BHH Counselor (Signed)
Disposition:   [name], NP, overnight observation at [facility] and reassessed in the morning

## 2019-02-08 NOTE — ED Notes (Signed)
Pt requesting medications. Keeps coming out of room to triage desk.

## 2019-02-08 NOTE — ED Notes (Signed)
[facility], Jerrye Beavers, wants a call [phone] after blood work results.

## 2019-02-08 NOTE — BH Assessment (Signed)
Tele Assessment Note   Patient Name: Blake Long. MRN: [id number] Referring Physician: Donnetta Hutching, MD Location of Patient: [facility] Location of Provider: Behavioral Health TTS Department  Blake Long. is an 44 y.o. male presents to [facility] Ed after being picked up by EMS at a hair salon ans was "unresponsive". Patient present with disorganized thoughts and limited speech. When questions was asked of patient he starred and appeared as if he wanted to speak but couldn't. When asked where does he live patient took out his wallet and mumble. TTS assessor unable to understand patient response. Due to altered mental status the assessor ended the assessment and conducted patient's mother Blake Long. She expressed frustration with [facility] for discharging her son last week stating someone told her 'he was at his baseline.' Report patient has not been acting like himself and his mental orientation has been declining since he was released. Report the owner of the group home addressed the concern with her as well. Report around the holiday Blake Long was talking and spent 4 days with her. Report she does not understand how his mental status declined. Report she was told on Saturday he would be going to [facility] then received another call stating he would be discharged. Report she wants her son to get the help he needs. Report patient complained about hearing voices, stating the voices was getting worse. She denied client complained about suicidal /homicidal ideations.   Patient per that note, received outpatient mental health services at [facility]. He has multiple inpatient hospitalizations at [facility] 3x's in the past. Last admission was 2000 for psychotic related symptoms. Patient at that time was at a group home managed by Blake Long [phone]. Patient now lives at a group home through [facility]. Patient receives outpatient mental health services at [facility].   Blake Chamber, NP, recommend overnight  observation re-assess in the morning   Diagnosis: Diagnosis: F25.0 Schizophrenia   Past Medical History:  Past Medical History:  Diagnosis Date  . Anxiety   . Cognitive impairment   . GERD (gastroesophageal reflux disease)   . Hyperglycemia   . Hypertension   . Overactive bladder   . Paranoid disorder ([facility])   . Schizophrenia ([facility])   . Seizure disorder [facility])     History reviewed. No pertinent surgical history.  Family History: No family history on file.  Social History:  reports that he has never smoked. He does not have any smokeless tobacco history on file. He reports that he does not drink alcohol. No history on file for drug.  Additional Social History:  Alcohol / Drug Use Pain Medications: see MAR Prescriptions: see MAR Over the Counter: see MAR History of alcohol / drug use?: No history of alcohol / drug abuse  CIWA: CIWA-Ar BP: 111/75 Pulse Rate: (!) 59 COWS:    Allergies: No Known Allergies  Home Medications: (Not in a hospital admission)   OB/GYN Status:  No LMP for male patient.  General Assessment Data Location of Assessment: [facility] TTS Assessment: In system Is this a Tele or Face-to-Face Assessment?: Face-to-Face Is this an Initial Assessment or a Re-assessment for this encounter?: Initial Assessment Patient Accompanied by:: N/A Language Other than English: No Living Arrangements: (Lives in group home ([facility]) ) What gender do you identify as?: Male Marital status: Single Living Arrangements: Group Home([facility] ) Can pt return to current living arrangement?: Yes Admission Status: Voluntary Is patient capable of signing voluntary admission?: No Referral Source: Self/Family/Friend Insurance type:  Medicare      Crisis Care Plan Living Arrangements: Group Home([facility] ) Name of Psychiatrist: Beverly Long (per chart review )  Education Status Is patient currently in school?: No Is the patient employed, unemployed or  receiving disability?: Unemployed  Risk to self with the past 6 months Suicidal Ideation: No Has patient been a risk to self within the past 6 months prior to admission? : No Suicidal Intent: No Has patient had any suicidal intent within the past 6 months prior to admission? : No Is patient at risk for suicide?: No Suicidal Plan?: No Has patient had any suicidal plan within the past 6 months prior to admission? : No Access to Means: No What has been your use of drugs/alcohol within the last 12 months?: None report  Previous Attempts/Gestures: No(N/A) How many times?: 0 Other Self Harm Risks: UTA Triggers for Past Attempts: (UTA) Intentional Self Injurious Behavior: None Family Suicide History: No Recent stressful life event(s): (UTA) Persecutory voices/beliefs?: Blake Long) Depression: (UTA) Depression Symptoms: (UTA) Substance abuse history and/or treatment for substance abuse?: No Suicide prevention information given to non-admitted patients: Not applicable  Risk to Others within the past 6 months Homicidal Ideation: No Does patient have any lifetime risk of violence toward others beyond the six months prior to admission? : No Thoughts of Harm to Others: No Current Homicidal Intent: No Current Homicidal Plan: No Access to Homicidal Means: No Identified Victim: NA History of harm to others?: No Assessment of Violence: None Noted Violent Behavior Description: NA Does patient have access to weapons?: No Criminal Charges Pending?: No Does patient have a court date: No Is patient on probation?: No  Psychosis Hallucinations: (UTA) Delusions: None noted  Mental Status Report Appearance/Hygiene: Bizarre Eye Contact: Poor Motor Activity: Freedom of movement Speech: Other (Comment)(mumbled ) Level of Consciousness: Alert Mood: Suspicious Affect: Unable to Assess Anxiety Level: None Thought Processes: Unable to Assess Judgement: Unable to Assess Orientation: Unable to  assess Obsessive Compulsive Thoughts/Behaviors: Unable to Assess  Cognitive Functioning Concentration: Unable to Assess Memory: Unable to Assess Is patient IDD: No Insight: Unable to Assess Impulse Control: Unable to Assess Appetite: (UTA) Have you had any weight changes? : (UTA) Sleep: (UTA) Total Hours of Sleep: (UTA) Vegetative Symptoms: Unable to Assess  ADLScreening [facility] Assessment Services) Patient's cognitive ability adequate to safely complete daily activities?: No Patient able to express need for assistance with ADLs?: No Independently performs ADLs?: Yes (appropriate for developmental age)  Prior Inpatient Therapy Prior Inpatient Therapy: Yes Prior Therapy Dates: 2018 Prior Therapy Facilty/Provider(s): [facility] Reason for Treatment: MH issues  Prior Outpatient Therapy Prior Outpatient Therapy: Yes Prior Therapy Dates: Ongoing Prior Therapy Facilty/Provider(s): [facility] Reason for Treatment: Med mang Does patient have an ACCT team?: No Does patient have Intensive In-House Services?  : No Does patient have [facility] services? : Yes Does patient have P4CC services?: No  ADL Screening (condition at time of admission) Patient's cognitive ability adequate to safely complete daily activities?: No Is the patient deaf or have difficulty hearing?: No Does the patient have difficulty seeing, even when wearing glasses/contacts?: No Does the patient have difficulty concentrating, remembering, or making decisions?: Yes Patient able to express need for assistance with ADLs?: No Does the patient have difficulty dressing or bathing?: No Independently performs ADLs?: Yes (appropriate for developmental age) Does the patient have difficulty walking or climbing stairs?: No       Abuse/Neglect Assessment (Assessment to be complete while patient is alone) Abuse/Neglect Assessment Can Be Completed: Yes  Physical Abuse: Denies Verbal Abuse: Denies Sexual Abuse: Denies Exploitation of  patient/patient's resources: Denies Self-Neglect: Denies     Merchant navy officer (For Healthcare) Does Patient Have a Medical Advance Directive?: Unable to assess, patient is non-responsive or altered mental status          Disposition:  Disposition Initial Assessment Completed for this Encounter: Richardean Sale, NP, observe overnight )  This service was provided via telemedicine using a 2-way, interactive audio and video technology.  Names of all persons participating in this telemedicine service and their role in this encounter. Name: Teoman Giraud ([phone]) Role: mother   Name: Lucille Crichlow Role: TTS assessor   Name:  Role:   Name:  Role:     Dian Situ 02/08/2019 6:33 PM

## 2019-02-09 ENCOUNTER — Emergency Department (HOSPITAL_COMMUNITY): Payer: Medicare HMO

## 2019-02-09 DIAGNOSIS — F250 Schizoaffective disorder, bipolar type: Secondary | ICD-10-CM | POA: Diagnosis not present

## 2019-02-09 DIAGNOSIS — R4182 Altered mental status, unspecified: Secondary | ICD-10-CM | POA: Diagnosis not present

## 2019-02-09 DIAGNOSIS — R569 Unspecified convulsions: Secondary | ICD-10-CM | POA: Insufficient documentation

## 2019-02-09 LAB — SARS CORONAVIRUS 2 (TAT 6-24 HRS): SARS Coronavirus 2: NEGATIVE

## 2019-02-09 LAB — URINALYSIS, ROUTINE W REFLEX MICROSCOPIC
Bilirubin Urine: NEGATIVE
Glucose, UA: NEGATIVE mg/dL
Hgb urine dipstick: NEGATIVE
Ketones, ur: 20 mg/dL — AB
Leukocytes,Ua: NEGATIVE
Nitrite: NEGATIVE
Protein, ur: NEGATIVE mg/dL
Specific Gravity, Urine: 1.011 (ref 1.005–1.030)
pH: 6 (ref 5.0–8.0)

## 2019-02-09 LAB — CBG MONITORING, ED: Glucose-Capillary: 89 mg/dL (ref 70–99)

## 2019-02-09 LAB — VALPROIC ACID LEVEL: Valproic Acid Lvl: 14 ug/mL — ABNORMAL LOW (ref 50.0–100.0)

## 2019-02-09 LAB — TSH: TSH: 1.818 u[IU]/mL (ref 0.350–4.500)

## 2019-02-09 MED ORDER — BENZTROPINE MESYLATE 1 MG PO TABS
2.0000 mg | ORAL_TABLET | Freq: Every day | ORAL | Status: DC
  Administered 2019-02-09 – 2019-02-10 (×2): 2 mg via ORAL
  Filled 2019-02-09 (×2): qty 2

## 2019-02-09 MED ORDER — CLOZAPINE 100 MG PO TABS
200.0000 mg | ORAL_TABLET | Freq: Every day | ORAL | Status: DC
  Administered 2019-02-09 – 2019-02-10 (×2): 200 mg via ORAL
  Filled 2019-02-09 (×3): qty 2

## 2019-02-09 MED ORDER — CLOZAPINE 100 MG PO TABS
200.0000 mg | ORAL_TABLET | Freq: Every day | ORAL | Status: DC
  Administered 2019-02-10 – 2019-02-11 (×2): 200 mg via ORAL
  Filled 2019-02-09 (×2): qty 2

## 2019-02-09 NOTE — ED Notes (Signed)
Pt drooling, slurred speech, unsteady gait.

## 2019-02-09 NOTE — Progress Notes (Addendum)
02/09/2019  Spoke with Inetta Fermo, NP and informed her that the patient is different than the way I saw him last week. Also secure chat Dr Lynelle Doctor about patient. Last week he was alert, oriented, walking, talking, eating, taken care of bathroom needs all independently. Today the patient seems confused, lethargic, drooling, unsteady, needs assistance with bathroom and eating. Hard to understand what he's saying.

## 2019-02-09 NOTE — ED Notes (Addendum)
Pt found soiled of urine, pt walked to bathroom and changed into dry scrub pants and walked back to bed without difficulty.

## 2019-02-09 NOTE — Progress Notes (Signed)
02/09/2019  Labs drawn. Red and Gold tubes sent to main lab.

## 2019-02-09 NOTE — ED Provider Notes (Addendum)
Emergency Medicine Observation Re-evaluation Note  Blake Long. is a 44 y.o. male, seen on rounds today.  Pt initially presented to the ED for complaints of Hallucinations and Suicidal Currently, the patient is sleeping this am.  Physical Exam  BP (!) 121/94   Pulse 93   Temp 98.4 F (36.9 C) (Oral)   Resp 18   SpO2 94%  Physical Exam Constitutional:      Comments: Sleeping   HENT:     Head: Normocephalic and atraumatic.     Nose: No rhinorrhea.  Pulmonary:     Effort: No respiratory distress.  Neurological:     Comments: Sleeping      ED Course / MDM  EKG:    I have reviewed the labs performed to date as well as medications administered while in observation.  Recent changes in the last 24 hours include , assessed by psychiatry yesterday evening.  Plan was for reassessment today.. Plan  Current plan is for psychiatry reassessment Patient is not under full IVC at this time.   Linwood Dibbles, MD 02/09/19 [phone]  Notified pt is still listless this am.  Drooling. On exam, pt will look at me with stimulation.  Listless.  CBG normal.  Will ct head but suspect this is from his medications.  Discussed with rn to hold future doses.    Linwood Dibbles, MD 02/09/19 [phone]

## 2019-02-09 NOTE — ED Notes (Signed)
Pt. In burgundy scrubs. Pt. wanded by security. Pt. Has 2 belongings bags. Pt. Has 1 pr. Shoes, 1 black/white striped shirt, 1 black /gray jacket, and 1 pr. White socks. Pt. Belongings locked up at the nurses station @ [facility]. Pt. Has no wallet or cell phone.

## 2019-02-09 NOTE — Progress Notes (Signed)
02/09/2019  0820  Blake Long [phone] from the group home called to get an update.

## 2019-02-09 NOTE — ED Notes (Signed)
Pt resting comfortably . Pt has slurred speech, unsteady gait, pt needs assist with ADLs currently.

## 2019-02-09 NOTE — Consult Note (Addendum)
Telepsych Consultation   Reason for Consult: Disorganized thoughts Referring Physician: Wonda [facility], EDP Location of Patient: [facility] department Location of Provider: [facility]  Patient Identification: Blake HomansJerome Arterberry Jr. MRN:  [id number] Principal Diagnosis: <principal problem not specified> Diagnosis:  Active Problems:   Schizophrenia (HCC)   Cognitive impairment   Total Time spent with patient: 1 hour  Subjective:   Blake HomansJerome Boruff Jr. is a 44 y.o. male patient admitted with disorganized thoughts.  Patient assessed by nurse practitioner.  Patient appears asleep upon initiation of telemedicine evaluation.  Patient arouses easily but difficult to engage in assessment.  Patient appears drowsy, possibly confused. Patient verbalizes he is from a group home.  Patient states group home is "all right."  Patient denies suicidal and homicidal ideations.  Patient denies auditory visual hallucinations.  Limited participation in assessment by patient.  Patient gives consent to talk to his mother, Blake PayorBeverly Abdullah. Spoke with patient's mother [facility] Long phone number [phone]: Patient's mother reports patient increasingly confused since Thursday, January 14.  Per patient's mother patient was "more himself and alert" last on Monday, January 11 when she spoke with patient on phone.  Patient's mother expresses concern with group home staff and medication administration.  Patient's mother expresses concerns about patient's delayed urination, patient's mother states she noticed delayed urination during Thanksgiving holiday when he stated her home.  Patient's mother expresses concern about group home staff states with negative Covid screen she would like to take patient to  her home upon discharge. Patient seen along with Dr. Jannifer FranklinAkintayo, medication recommendations initiated.  We will continue to monitor patient in emergency department. HPI: Patient admitted with disorganized  thoughts and altered mental status.  Past Psychiatric History: Schizophrenia  Risk to Self: Suicidal Ideation: No Suicidal Intent: No Is patient at risk for suicide?: No Suicidal Plan?: No Access to Means: No What has been your use of drugs/alcohol within the last 12 months?: None report  How many times?: 0 Other Self Harm Risks: UTA Triggers for Past Attempts: (UTA) Intentional Self Injurious Behavior: None Risk to Others: Homicidal Ideation: No Thoughts of Harm to Others: No Current Homicidal Intent: No Current Homicidal Plan: No Access to Homicidal Means: No Identified Victim: NA History of harm to others?: No Assessment of Violence: None Noted Violent Behavior Description: NA Does patient have access to weapons?: No Criminal Charges Pending?: No Does patient have a court date: No Prior Inpatient Therapy: Prior Inpatient Therapy: Yes Prior Therapy Dates: 2018 Prior Therapy Facilty/Provider(s): [facility] Reason for Treatment: MH issues Prior Outpatient Therapy: Prior Outpatient Therapy: Yes Prior Therapy Dates: Ongoing Prior Therapy Facilty/Provider(s): [facility] Reason for Treatment: Med mang Does patient have an ACCT team?: No Does patient have Intensive In-House Services?  : No Does patient have [facility] services? : Yes Does patient have P4CC services?: No  Past Medical History:  Past Medical History:  Diagnosis Date  . Anxiety   . Cognitive impairment   . GERD (gastroesophageal reflux disease)   . Hyperglycemia   . Hypertension   . Overactive bladder   . Paranoid disorder (HCC)   . Schizophrenia (HCC)   . Seizure disorder [facility](HCC)    History reviewed. No pertinent surgical history. Family History: No family history on file. Family Psychiatric  History: Father-schizophrenia Social History:  Social History   Substance and Sexual Activity  Alcohol Use No     Social History   Substance and Sexual Activity  Drug Use Not on file    Social History  Socioeconomic  History  . Marital status: Single    Spouse name: Not on file  . Number of children: Not on file  . Years of education: Not on file  . Highest education level: Not on file  Occupational History  . Not on file  Tobacco Use  . Smoking status: Never Smoker  Substance and Sexual Activity  . Alcohol use: No  . Drug use: Not on file  . Sexual activity: Not on file  Other Topics Concern  . Not on file  Social History Narrative  . Not on file   Social Determinants of Health   Financial Resource Strain:   . Difficulty of Paying Living Expenses: Not on file  Food Insecurity:   . Worried About Programme researcher, broadcasting/film/video in the Last Year: Not on file  . Ran Out of Food in the Last Year: Not on file  Transportation Needs:   . Lack of Transportation (Medical): Not on file  . Lack of Transportation (Non-Medical): Not on file  Physical Activity:   . Days of Exercise per Week: Not on file  . Minutes of Exercise per Session: Not on file  Stress:   . Feeling of Stress : Not on file  Social Connections:   . Frequency of Communication with Friends and Family: Not on file  . Frequency of Social Gatherings with Friends and Family: Not on file  . Attends Religious Services: Not on file  . Active Member of Clubs or Organizations: Not on file  . Attends Banker Meetings: Not on file  . Marital Status: Not on file   Additional Social History:    Allergies:  No Known Allergies  Labs:  Results for orders placed or performed during the hospital encounter of 02/08/19 (from the past 48 hour(s))  CBC with Differential     Status: None   Collection Time: 02/08/19  5:32 PM  Result Value Ref Range   WBC 7.1 4.0 - 10.5 K/uL   RBC 4.57 4.22 - 5.81 MIL/uL   Hemoglobin 14.2 13.0 - 17.0 g/dL   HCT 40.3 47.4 - 25.9 %   MCV 93.7 80.0 - 100.0 fL   MCH 31.1 26.0 - 34.0 pg   MCHC 33.2 30.0 - 36.0 g/dL   RDW 56.3 87.5 - 64.3 %   Platelets 212 150 - 400 K/uL   nRBC 0.0 0.0 - 0.2 %   Neutrophils  Relative % 68 %   Neutro Abs 4.7 1.7 - 7.7 K/uL   Lymphocytes Relative 26 %   Lymphs Abs 1.8 0.7 - 4.0 K/uL   Monocytes Relative 6 %   Monocytes Absolute 0.4 0.1 - 1.0 K/uL   Eosinophils Relative 0 %   Eosinophils Absolute 0.0 0.0 - 0.5 K/uL   Basophils Relative 0 %   Basophils Absolute 0.0 0.0 - 0.1 K/uL   Immature Granulocytes 0 %   Abs Immature Granulocytes 0.03 0.00 - 0.07 K/uL    Comment: Performed at [facility], [address], [place], [place] [address]  Basic metabolic panel     Status: Abnormal   Collection Time: 02/08/19  5:32 PM  Result Value Ref Range   Sodium 139 135 - 145 mmol/L   Potassium 3.9 3.5 - 5.1 mmol/L   Chloride 103 98 - 111 mmol/L   CO2 25 22 - 32 mmol/L   Glucose, Bld 104 (H) 70 - 99 mg/dL   BUN 22 (H) 6 - 20 mg/dL   Creatinine, Ser  0.90 0.61 - 1.24 mg/dL   Calcium 9.8 8.9 - 62.6 mg/dL   GFR calc non Af Amer >60 >60 mL/min   GFR calc Af Amer >60 >60 mL/min   Anion gap 11 5 - 15    Comment: Performed at [facility], [address] [place], [place] [address]  Ethanol     Status: None   Collection Time: 02/08/19  5:32 PM  Result Value Ref Range   Alcohol, Ethyl (B) <10 <10 mg/dL    Comment: (NOTE) Lowest detectable limit for serum alcohol is 10 mg/dL. For medical purposes only. Performed at [facility], [address], [place], [place] [address]   Urine rapid drug screen (hosp performed)     Status: Abnormal   Collection Time: 02/08/19  7:59 PM  Result Value Ref Range   Opiates NONE DETECTED NONE DETECTED   Cocaine NONE DETECTED NONE DETECTED   Benzodiazepines POSITIVE (A) NONE DETECTED   Amphetamines NONE DETECTED NONE DETECTED   Tetrahydrocannabinol NONE DETECTED NONE DETECTED   Barbiturates NONE DETECTED NONE DETECTED    Comment: (NOTE) DRUG SCREEN FOR MEDICAL PURPOSES ONLY.  IF CONFIRMATION IS NEEDED FOR ANY PURPOSE, NOTIFY LAB WITHIN 5 DAYS. LOWEST DETECTABLE LIMITS FOR URINE  DRUG SCREEN Drug Class                     Cutoff (ng/mL) Amphetamine and metabolites    1000 Barbiturate and metabolites    200 Benzodiazepine                 200 Tricyclics and metabolites     300 Opiates and metabolites        300 Cocaine and metabolites        300 THC                            50 Performed at [facility], [address], [place], [place] [address]   Valproic acid level     Status: Abnormal   Collection Time: 02/09/19 12:45 AM  Result Value Ref Range   Valproic Acid Lvl 14 (L) 50.0 - 100.0 ug/mL    Comment: Performed at [facility], [address], [place], [place] [address]  CBG monitoring, ED     Status: None   Collection Time: 02/09/19 11:51 AM  Result Value Ref Range   Glucose-Capillary 89 70 - 99 mg/dL    Medications:  Current Facility-Administered Medications  Medication Dose Route Frequency Provider Last Rate Last Admin  . benztropine (COGENTIN) tablet 2 mg  2 mg Oral QHS Masyn Rostro, MD      . busPIRone (BUSPAR) tablet 5 mg  5 mg Oral TID Donnetta Hutching, MD   5 mg at 02/09/19 0943  . [START ON 02/10/2019] cloZAPine (CLOZARIL) tablet 200 mg  200 mg Oral Daily Moshe Wenger, MD      . cloZAPine (CLOZARIL) tablet 200 mg  200 mg Oral QHS Audrie Kuri, MD      . divalproex (DEPAKOTE ER) 24 hr tablet 1,000 mg  1,000 mg Oral QHS Donnetta Hutching, MD   1,000 mg at 02/09/19 0036  . docusate sodium (COLACE) capsule 100 mg  100 mg Oral Daily PRN Donnetta Hutching, MD      . hydrOXYzine (ATARAX/VISTARIL) tablet 25 mg  25 mg Oral QID PRN Donnetta Hutching, MD      . levETIRAcetam (KEPPRA) tablet 500 mg  500 mg Oral BID Nat Christen, MD   500 mg at 02/09/19 0944  . loratadine (CLARITIN) tablet 10 mg  10 mg Oral Daily PRN Nat Christen, MD      . pantoprazole (PROTONIX) EC tablet 40 mg  40 mg Oral Daily Nat Christen, MD   40 mg at 02/09/19 0960   Current Outpatient Medications  Medication Sig Dispense Refill  . benztropine (COGENTIN) 1  MG tablet Take 2 mg by mouth at bedtime.     . busPIRone (BUSPAR) 5 MG tablet Take 5 mg by mouth 3 (three) times daily.    . cloZAPine (CLOZARIL) 100 MG tablet Take 400-500 mg by mouth 2 (two) times daily. Takes four tablets in the morning and 5 tablets at night.    . divalproex (DEPAKOTE ER) 500 MG 24 hr tablet Take 1,000 mg by mouth at bedtime.     . docusate sodium (COLACE) 100 MG capsule Take 100 mg by mouth daily as needed for mild constipation.    . hydrOXYzine (ATARAX/VISTARIL) 25 MG tablet Take 25 mg by mouth 4 (four) times daily as needed for anxiety.    . levETIRAcetam (KEPPRA) 500 MG tablet Take 1 tablet (500 mg total) by mouth 2 (two) times daily. 60 tablet 0  . loratadine (CLARITIN) 10 MG tablet Take 10 mg by mouth daily as needed for allergies.    Marland Kitchen omeprazole (PRILOSEC) 20 MG capsule Take 2 capsules (40 mg total) by mouth daily. (Patient not taking: Reported on 02/08/2019) 30 capsule 0    Musculoskeletal: Strength & Muscle Tone: unable to assess Gait & Station: unable to assess Patient leans: unable to assess  Psychiatric Specialty Exam: Physical Exam  Nursing note and vitals reviewed. Constitutional: He appears well-developed.  HENT:  Head: Normocephalic.  Cardiovascular: Normal rate.  Respiratory: Effort normal.  Neurological: He is alert.  Psychiatric: His speech is slurred. He is slowed. Cognition and memory are impaired.    Review of Systems  Constitutional: Negative.   HENT: Negative.   Eyes: Negative.   Respiratory: Negative.   Cardiovascular: Negative.   Gastrointestinal: Negative.   Genitourinary: Negative.   Musculoskeletal: Negative.   Skin: Negative.   Neurological: Positive for weakness.  Psychiatric/Behavioral: Positive for decreased concentration.    Blood pressure 98/63, pulse 90, temperature (!) 97.5 F (36.4 C), temperature source Axillary, resp. rate 14, SpO2 99 %.There is no height or weight on file to calculate BMI.  General Appearance:  Disheveled  Eye Contact:  Minimal  Speech:  Slow and Slurred  Volume:  Normal  Mood:  Euthymic  Affect:  Congruent  Thought Process:  Disorganized and Descriptions of Associations: Intact  Orientation:  Full (Time, Place, and Person)  Thought Content:  Logical  Suicidal Thoughts:  No  Homicidal Thoughts:  No  Memory:  Immediate;   Fair Recent;   Fair Remote;   Fair  Judgement:  Intact  Insight:  Fair  Psychomotor Activity:  Decreased  Concentration:  Concentration: Poor and Attention Span: Poor  Recall:  [employer] of Knowledge:  Fair  Language:  Fair  Akathisia:  No  Handed:  Right  AIMS (if indicated):     Assets:  Communication Skills Desire for Improvement Financial Resources/Insurance Housing Leisure Time Physical Health Resilience Social Support  ADL's:  Impaired  Cognition:  Impaired,  Mild  Sleep:        Treatment Plan Summary: Daily contact with patient to assess and evaluate symptoms and progress in treatment and Medication management  Disposition: No evidence of imminent risk to self or others at present.    This service was provided via telemedicine using a 2-way, interactive audio and video technology.  Names of all persons participating in this telemedicine service and their role in this encounter. Name: Shahin Knierim Patient  Name: Blake Payor- via telephone Role: Patient's mother  Name: Berneice Heinrich Role: FNP    Patrcia Dolly, FNP 02/09/2019 1:34 PM Patient seen face-to-face for psychiatric evaluation, chart reviewed and case discussed with the physician extender and developed treatment plan. Reviewed the information documented and agree with the treatment plan. Thedore Mins, MD

## 2019-02-10 DIAGNOSIS — F250 Schizoaffective disorder, bipolar type: Secondary | ICD-10-CM | POA: Diagnosis not present

## 2019-02-10 NOTE — ED Provider Notes (Signed)
I was asked to evaluate patient regarding elevated heart rate.  Patient from a group home for evaluation of hallucinations.  Patient's heart rate to my measurement is approximately 110 at rest.  He appears well and has no complaints except for being hungry and thirsty.  Patient will be given a snack/beverage.  To be rechecked again later this evening.   Geoffery Lyons, MD 02/10/19 [phone]

## 2019-02-10 NOTE — ED Notes (Signed)
Notified MD of HR.

## 2019-02-10 NOTE — ED Notes (Signed)
Reports to Clinical research associate he is feeling better. States voices arent coming as often and he can talk better. He is pleasant and appropriate. He is able to voice his needs. Ate a sandwich and had several cups of fluids. He is not SI or HI. He is med compliant. Tech at doorway to monitor his safety.

## 2019-02-10 NOTE — ED Notes (Signed)
Pt resting comfortably .   Pt continues to sleep, slurred speech, unsteady gait.

## 2019-02-10 NOTE — Consult Note (Addendum)
Telepsych Consultation   Reason for Consult: Disorganized thoughts Referring Physician: Wonda [facility]sWesley Long, EDP Location of Patient: [facility] emergency department Location of Provider: [facility]  Patient Identification: Blake Long. MRN:  [id number] Principal Diagnosis: <principal problem not specified> Diagnosis:  Active Problems:   Schizophrenia (HCC)   Cognitive impairment   Total Time spent with patient: 30 minutes  Subjective:   02/10/2019 patient assessed by nurse practitioner.  Patient appears much more alert today, oriented x4.  Patient participates appropriately in assessment.  Patient denies suicidal and homicidal ideations.  Patient denies visual hallucinations.  Patient reports auditory hallucinations, states I hear voices they say "stuff, insults, and mean things to me."  Patient denies command hallucinations. Patient reports average sleep and average appetite.  Patient reports taking medications as ordered both in the hospital and at the group home where he lives. Patient seen along with Dr. Jannifer FranklinAkintayo.  01/16/2021Jerome Polo RileyWillis Long. is a 44 y.o. male patient admitted with disorganized thoughts.  Patient assessed by nurse practitioner.  Patient appears asleep upon initiation of telemedicine evaluation.  Patient arouses easily but difficult to engage in assessment.  Patient appears drowsy, possibly confused. Patient verbalizes he is from a group home.  Patient states group home is "all right."  Patient denies suicidal and homicidal ideations.  Patient denies auditory visual hallucinations.  Limited participation in assessment by patient.  Patient gives consent to talk to his mother, Sherrye PayorBeverly Laverne. Spoke with patient's mother Sherrye PayorBeverly Manfredo phone number [phone]: Patient's mother reports patient increasingly confused since Thursday, January 14.  Per patient's mother patient was "more himself and alert" last on Monday, January 11 when she spoke with patient on  phone.  Patient's mother expresses concern with group home staff and medication administration.  Patient's mother expresses concerns about patient's delayed urination, patient's mother states she noticed delayed urination during Thanksgiving holiday when he stated her home.  Patient's mother expresses concern about group home staff states with negative Covid screen she would like to take patient to  her home upon discharge. Patient seen along with Dr. Jannifer FranklinAkintayo, medication recommendations initiated.  We will continue to monitor patient in emergency department. HPI: Patient admitted with disorganized thoughts and altered mental status.  Past Psychiatric History: Schizophrenia  Risk to Self: Suicidal Ideation: No Suicidal Intent: No Is patient at risk for suicide?: No Suicidal Plan?: No Access to Means: No What has been your use of drugs/alcohol within the last 12 months?: None report  How many times?: 0 Other Self Harm Risks: UTA Triggers for Past Attempts: (UTA) Intentional Self Injurious Behavior: None Risk to Others: Homicidal Ideation: No Thoughts of Harm to Others: No Current Homicidal Intent: No Current Homicidal Plan: No Access to Homicidal Means: No Identified Victim: NA History of harm to others?: No Assessment of Violence: None Noted Violent Behavior Description: NA Does patient have access to weapons?: No Criminal Charges Pending?: No Does patient have a court date: No Prior Inpatient Therapy: Prior Inpatient Therapy: Yes Prior Therapy Dates: 2018 Prior Therapy Facilty/Provider(s): [facility] Reason for Treatment: MH issues Prior Outpatient Therapy: Prior Outpatient Therapy: Yes Prior Therapy Dates: Ongoing Prior Therapy Facilty/Provider(s): [facility] Reason for Treatment: Med mang Does patient have an ACCT team?: No Does patient have Intensive In-House Services?  : No Does patient have [facility] services? : Yes Does patient have P4CC services?: No  Past Medical History:  Past  Medical History:  Diagnosis Date  . Anxiety   . Cognitive impairment   . GERD (gastroesophageal reflux disease)   .  Hyperglycemia   . Hypertension   . Overactive bladder   . Paranoid disorder (HCC)   . Schizophrenia (HCC)   . Seizure disorder [facility])    History reviewed. No pertinent surgical history. Family History: No family history on file. Family Psychiatric  History: Father-schizophrenia Social History:  Social History   Substance and Sexual Activity  Alcohol Use No     Social History   Substance and Sexual Activity  Drug Use Not on file    Social History   Socioeconomic History  . Marital status: Single    Spouse name: Not on file  . Number of children: Not on file  . Years of education: Not on file  . Highest education level: Not on file  Occupational History  . Not on file  Tobacco Use  . Smoking status: Never Smoker  Substance and Sexual Activity  . Alcohol use: No  . Drug use: Not on file  . Sexual activity: Not on file  Other Topics Concern  . Not on file  Social History Narrative  . Not on file   Social Determinants of Health   Financial Resource Strain:   . Difficulty of Paying Living Expenses: Not on file  Food Insecurity:   . Worried About Programme researcher, broadcasting/film/video in the Last Year: Not on file  . Ran Out of Food in the Last Year: Not on file  Transportation Needs:   . Lack of Transportation (Medical): Not on file  . Lack of Transportation (Non-Medical): Not on file  Physical Activity:   . Days of Exercise per Week: Not on file  . Minutes of Exercise per Session: Not on file  Stress:   . Feeling of Stress : Not on file  Social Connections:   . Frequency of Communication with Friends and Family: Not on file  . Frequency of Social Gatherings with Friends and Family: Not on file  . Attends Religious Services: Not on file  . Active Member of Clubs or Organizations: Not on file  . Attends Banker Meetings: Not on file  . Marital  Status: Not on file   Additional Social History:    Allergies:  No Known Allergies  Labs:  Results for orders placed or performed during the hospital encounter of 02/08/19 (from the past 48 hour(s))  CBC with Differential     Status: None   Collection Time: 02/08/19  5:32 PM  Result Value Ref Range   WBC 7.1 4.0 - 10.5 K/uL   RBC 4.57 4.22 - 5.81 MIL/uL   Hemoglobin 14.2 13.0 - 17.0 g/dL   HCT 93.8 10.1 - 75.1 %   MCV 93.7 80.0 - 100.0 fL   MCH 31.1 26.0 - 34.0 pg   MCHC 33.2 30.0 - 36.0 g/dL   RDW 02.5 85.2 - 77.8 %   Platelets 212 150 - 400 K/uL   nRBC 0.0 0.0 - 0.2 %   Neutrophils Relative % 68 %   Neutro Abs 4.7 1.7 - 7.7 K/uL   Lymphocytes Relative 26 %   Lymphs Abs 1.8 0.7 - 4.0 K/uL   Monocytes Relative 6 %   Monocytes Absolute 0.4 0.1 - 1.0 K/uL   Eosinophils Relative 0 %   Eosinophils Absolute 0.0 0.0 - 0.5 K/uL   Basophils Relative 0 %   Basophils Absolute 0.0 0.0 - 0.1 K/uL   Immature Granulocytes 0 %   Abs Immature Granulocytes 0.03 0.00 - 0.07 K/uL    Comment: Performed at [facility], [place] [address], [place], [place] [address]  Basic metabolic panel     Status: Abnormal   Collection Time: 02/08/19  5:32 PM  Result Value Ref Range   Sodium 139 135 - 145 mmol/L   Potassium 3.9 3.5 - 5.1 mmol/L   Chloride 103 98 - 111 mmol/L   CO2 25 22 - 32 mmol/L   Glucose, Bld 104 (H) 70 - 99 mg/dL   BUN 22 (H) 6 - 20 mg/dL   Creatinine, Ser 0.90 0.61 - 1.24 mg/dL   Calcium 9.8 8.9 - 10.3 mg/dL   GFR calc non Af Amer >60 >60 mL/min   GFR calc Af Amer >60 >60 mL/min   Anion gap 11 5 - 15    Comment: Performed at [facility], [place] [address] [place], [place] [address]  Ethanol     Status: None   Collection Time: 02/08/19  5:32 PM  Result Value Ref Range   Alcohol, Ethyl (B) <10 <10 mg/dL    Comment: (NOTE) Lowest detectable limit for serum alcohol is 10 mg/dL. For medical purposes only. Performed at [facility], [place], [place] [address] [place], [place] [address]   Urine rapid drug screen (hosp performed)     Status: Abnormal   Collection Time: 02/08/19  7:59 PM  Result Value Ref Range   Opiates NONE DETECTED NONE DETECTED   Cocaine NONE DETECTED NONE DETECTED   Benzodiazepines POSITIVE (A) NONE DETECTED   Amphetamines NONE DETECTED NONE DETECTED   Tetrahydrocannabinol NONE DETECTED NONE DETECTED   Barbiturates NONE DETECTED NONE DETECTED    Comment: (NOTE) DRUG SCREEN FOR MEDICAL PURPOSES ONLY.  IF CONFIRMATION IS NEEDED FOR ANY PURPOSE, NOTIFY LAB WITHIN 5 DAYS. LOWEST DETECTABLE LIMITS FOR URINE DRUG SCREEN Drug Class                     Cutoff (ng/mL) Amphetamine and metabolites    1000 Barbiturate and metabolites    200 Benzodiazepine                 938 Tricyclics and metabolites     300 Opiates and metabolites        300 Cocaine and metabolites        300 THC                            50 Performed at [facility], [place] [address] [place], [place] [address]   Valproic acid level     Status: Abnormal   Collection Time: 02/09/19 12:45 AM  Result Value Ref Range   Valproic Acid Lvl 14 (L) 50.0 - 100.0 ug/mL    Comment: Performed at [facility], [place] [address], [place], [place] [address]  CBG monitoring, ED     Status: None   Collection Time: 02/09/19 11:51 AM  Result Value Ref Range   Glucose-Capillary 89 70 - 99 mg/dL  Urinalysis, Routine w reflex microscopic     Status: Abnormal   Collection Time: 02/09/19  1:01 PM  Result Value Ref Range   Color, Urine YELLOW YELLOW   APPearance CLEAR CLEAR   Specific Gravity, Urine 1.011 1.005 - 1.030   pH 6.0 5.0 - 8.0   Glucose, UA NEGATIVE NEGATIVE mg/dL   Hgb urine dipstick NEGATIVE NEGATIVE   Bilirubin Urine NEGATIVE NEGATIVE   Ketones, ur 20 (A) NEGATIVE mg/dL   Protein, ur NEGATIVE NEGATIVE  mg/dL   Nitrite NEGATIVE NEGATIVE   Leukocytes,Ua NEGATIVE NEGATIVE    Comment: Performed at  [facility], [address], [place], [place] [address]  SARS CORONAVIRUS 2 (TAT 6-24 HRS) Nasopharyngeal Nasopharyngeal Swab     Status: None   Collection Time: 02/09/19  1:01 PM   Specimen: Nasopharyngeal Swab  Result Value Ref Range   SARS Coronavirus 2 NEGATIVE NEGATIVE    Comment: (NOTE) SARS-CoV-2 target nucleic acids are NOT DETECTED. The SARS-CoV-2 RNA is generally detectable in upper and lower respiratory specimens during the acute phase of infection. Negative results do not preclude SARS-CoV-2 infection, do not rule out co-infections with other pathogens, and should not be used as the sole basis for treatment or other patient management decisions. Negative results must be combined with clinical observations, patient history, and epidemiological information. The expected result is Negative. Fact Sheet for Patients: HairSlick.no Fact Sheet for Healthcare Providers: quierodirigir.com This test is not yet approved or cleared by the [place] FDA and  has been authorized for detection and/or diagnosis of SARS-CoV-2 by FDA under an Emergency Use Authorization (EUA). This EUA will remain  in effect (meaning this test can be used) for the duration of the COVID-19 declaration under Section 56 4(b)(1) of the Act, 21 U.S.C. section 360bbb-3(b)(1), unless the authorization is terminated or revoked sooner. Performed at [facility], [address] [place], [place] [address]   TSH     Status: None   Collection Time: 02/09/19  1:42 PM  Result Value Ref Range   TSH 1.818 0.350 - 4.500 uIU/mL    Comment: Performed by a 3rd Generation assay with a functional sensitivity of <=0.01 uIU/mL. Performed at [facility], [address], [place], [place] [address]     Medications:  Current Facility-Administered Medications  Medication Dose Route Frequency Provider Last Rate Last Admin  .  benztropine (COGENTIN) tablet 2 mg  2 mg Oral QHS Saryn Cherry, MD   2 mg at 02/09/19 2256  . busPIRone (BUSPAR) tablet 5 mg  5 mg Oral TID Donnetta Hutching, MD   5 mg at 02/09/19 2256  . cloZAPine (CLOZARIL) tablet 200 mg  200 mg Oral Daily Blimi Godby, MD      . cloZAPine (CLOZARIL) tablet 200 mg  200 mg Oral QHS Halston Fairclough, MD   200 mg at 02/09/19 2257  . divalproex (DEPAKOTE ER) 24 hr tablet 1,000 mg  1,000 mg Oral QHS Donnetta Hutching, MD   1,000 mg at 02/09/19 2256  . docusate sodium (COLACE) capsule 100 mg  100 mg Oral Daily PRN Donnetta Hutching, MD      . hydrOXYzine (ATARAX/VISTARIL) tablet 25 mg  25 mg Oral QID PRN Donnetta Hutching, MD      . levETIRAcetam (KEPPRA) tablet 500 mg  500 mg Oral BID Donnetta Hutching, MD   500 mg at 02/09/19 2256  . loratadine (CLARITIN) tablet 10 mg  10 mg Oral Daily PRN Donnetta Hutching, MD      . pantoprazole (PROTONIX) EC tablet 40 mg  40 mg Oral Daily Donnetta Hutching, MD   40 mg at 02/09/19 0623   Current Outpatient Medications  Medication Sig Dispense Refill  . benztropine (COGENTIN) 1 MG tablet Take 2 mg by mouth at bedtime.     . busPIRone (BUSPAR) 5 MG tablet Take 5 mg by mouth 3 (three) times daily.    . cloZAPine (CLOZARIL) 100 MG tablet Take 400-500 mg by mouth 2 (two) times  daily. Takes four tablets in the morning and 5 tablets at night.    . divalproex (DEPAKOTE ER) 500 MG 24 hr tablet Take 1,000 mg by mouth at bedtime.     . docusate sodium (COLACE) 100 MG capsule Take 100 mg by mouth daily as needed for mild constipation.    . hydrOXYzine (ATARAX/VISTARIL) 25 MG tablet Take 25 mg by mouth 4 (four) times daily as needed for anxiety.    . levETIRAcetam (KEPPRA) 500 MG tablet Take 1 tablet (500 mg total) by mouth 2 (two) times daily. 60 tablet 0  . loratadine (CLARITIN) 10 MG tablet Take 10 mg by mouth daily as needed for allergies.    Marland Kitchen. omeprazole (PRILOSEC) 20 MG capsule Take 2 capsules (40 mg total) by mouth daily. (Patient not taking: Reported on 02/08/2019)  30 capsule 0    Musculoskeletal: Strength & Muscle Tone: within normal limits Gait & Station: unable to assess Patient leans: unable to assess  Psychiatric Specialty Exam: Physical Exam  Nursing note and vitals reviewed. Constitutional: He is oriented to person, place, and time. He appears well-developed.  HENT:  Head: Normocephalic.  Cardiovascular: Normal rate.  Respiratory: Effort normal.  Neurological: He is alert and oriented to person, place, and time.  Psychiatric: He has a normal mood and affect. His speech is normal and behavior is normal. Thought content normal. Cognition and memory are normal.    Review of Systems  Constitutional: Negative.   HENT: Negative.   Eyes: Negative.   Respiratory: Negative.   Cardiovascular: Negative.   Gastrointestinal: Negative.   Genitourinary: Negative.   Musculoskeletal: Negative.   Skin: Negative.   Psychiatric/Behavioral: Positive for hallucinations.    Blood pressure 100/69, pulse (!) 102, temperature 98.4 F (36.9 C), temperature source Oral, resp. rate 19, SpO2 94 %.There is no height or weight on file to calculate BMI.  General Appearance: Casual and Fairly Groomed  Eye Contact:  Good  Speech:  Clear and Coherent and Normal Rate  Volume:  Normal  Mood:  Euthymic  Affect:  Congruent  Thought Process:  Coherent, Goal Directed and Descriptions of Associations: Intact  Orientation:  Full (Time, Place, and Person)  Thought Content:  Logical  Suicidal Thoughts:  No  Homicidal Thoughts:  No  Memory:  Immediate;   Fair Recent;   Fair Remote;   Fair  Judgement:  Intact  Insight:  Fair  Psychomotor Activity:  Normal  Concentration:  Concentration: Good and Attention Span: Good  Recall:  FiservFair  Fund of Knowledge:  Fair  Language:  Fair  Akathisia:  No  Handed:  Right  AIMS (if indicated):     Assets:  Communication Skills Desire for Improvement Financial Resources/Insurance Housing Leisure Time Physical  Health Resilience Social Support  ADL's:  Intact  Cognition:  WNL  Sleep:        Treatment Plan Summary: Daily contact with patient to assess and evaluate symptoms and progress in treatment and Medication management  Disposition: No evidence of imminent risk to self or others at present.    This service was provided via telemedicine using a 2-way, interactive audio and video technology.  Names of all persons participating in this telemedicine service and their role in this encounter. Name: Blake HomansJerome Gambrill Long Patient  Name: Berneice Heinrichina Tate Role: FNP    Patrcia Dollyina L Tate, FNP 02/10/2019 10:44 AM  Patient seen face-to-face for psychiatric evaluation, chart reviewed and case discussed with the physician extender and developed treatment plan. Reviewed the information documented  and agree with the treatment plan. Thedore Mins, MD

## 2019-02-11 ENCOUNTER — Encounter (HOSPITAL_COMMUNITY): Payer: Self-pay | Admitting: Registered Nurse

## 2019-02-11 ENCOUNTER — Telehealth: Payer: Self-pay | Admitting: *Deleted

## 2019-02-11 DIAGNOSIS — F209 Schizophrenia, unspecified: Secondary | ICD-10-CM

## 2019-02-11 DIAGNOSIS — R69 Illness, unspecified: Secondary | ICD-10-CM | POA: Diagnosis not present

## 2019-02-11 DIAGNOSIS — F250 Schizoaffective disorder, bipolar type: Secondary | ICD-10-CM | POA: Diagnosis not present

## 2019-02-11 DIAGNOSIS — R4189 Other symptoms and signs involving cognitive functions and awareness: Secondary | ICD-10-CM | POA: Diagnosis not present

## 2019-02-11 DIAGNOSIS — R440 Auditory hallucinations: Secondary | ICD-10-CM | POA: Diagnosis present

## 2019-02-11 MED ORDER — CLOZAPINE 200 MG PO TABS: 200.0000 mg | ORAL_TABLET | Freq: Two times a day (BID) | ORAL | 0 refills | Status: AC

## 2019-02-11 NOTE — Telephone Encounter (Signed)
TOC CM received call from pt's Group Home Admin that medication needed a provider registered with [facility] program. Message sent to ED provider, Alvy Beal NP. Isidoro Donning RN CCM, [facility] ED TOC CM [phone]

## 2019-02-11 NOTE — ED Notes (Signed)
Continue to wait for pt's ride

## 2019-02-11 NOTE — Discharge Instructions (Signed)
For your behavioral health needs, you are advised to continue treatment with [facility]: ° °     [facility] °     [address] °     [place],  [address] °     [phone] °     Crisis number: [phone] ° °

## 2019-02-11 NOTE — ED Notes (Signed)
Continue to wait for pt's ride from the Group Home.  Cathlean Cower said that she will call [facility] Long again.

## 2019-02-11 NOTE — Progress Notes (Addendum)
TOC CM spoke to [facility], Group Home Administrator for pick up. States they will need information on pt's labwork. Group Home Fax # [phone].Isidoro Donning RN CCM, [facility] ED TOC CM [phone]  5:16 pm Spoke to [facility], Group Home Admin, states she is on her way to pick up patient. Isidoro Donning RN CCM, [facility] ED TOC CM [phone]

## 2019-02-11 NOTE — ED Notes (Signed)
Pt discharged home. Discharged instructions read to pt who verbalized understanding. All belongings returned to pt who signed for same. Denies SI/HI, is not delusional and not responding to internal stimuli. Escorted pt to the ED exit.    Picked up by Care Giver at group home.

## 2019-02-11 NOTE — BH Assessment (Signed)
[facility] Assessment Progress Note  Per Shuvon Rankin, FNP, this pt does not require psychiatric hospitalization at this time.  Pt is to be discharged from [facility] with recommendation to continue treatment with [facility].  This has been included in pt's discharge instructions.  A social work consult is to be ordered to facilitate pt's return to his group home.  Pt's nurse, Diane, has been notified.  Doylene Canning, MA Triage Specialist [phone]

## 2019-02-11 NOTE — Consult Note (Addendum)
[facility] Psych ED Discharge  02/11/2019 2:03 PM Blake Homans.  MRN:  [id number] Principal Problem: Auditory hallucination Discharge Diagnoses: Principal Problem:   Auditory hallucination Active Problems:   Schizophrenia (HCC)   Cognitive impairment   Subjective: Blake Homans., 44 y.o., male patient seen via tele psych by this provider, Dr. Lucianne Muss; and chart reviewed on 02/11/19.  On evaluation Blake Mathew. reports he is feeling much better today. "the voice are going down, don't hear much."  Patient denies suicidal/self-harm/homicidal ideation, and paranoia.  Patient states that he lives in a group home and that he has no problem with home or staff.  States that he is ready to go back home.     During evaluation Blake Homans. is alert/oriented x 4; calm/cooperative; and mood is congruent with affect.  He does not appear to be responding to internal/external stimuli or delusional thoughts.  Patient denies suicidal/self-harm/homicidal ideation, and paranoia; continues to endorse auditory hallucinations which he states have decreased and is not a bother to him.  Patient possibly at baseline.  Patient answered question appropriately.  Social work consult ordered to consult with group home about picking patient up after discharge.   Total Time spent with patient: 30 minutes  Past Psychiatric History: Schizophrenia  Past Medical History:  Past Medical History:  Diagnosis Date  . Anxiety   . Cognitive impairment   . GERD (gastroesophageal reflux disease)   . Hyperglycemia   . Hypertension   . Overactive bladder   . Paranoid disorder (HCC)   . Schizophrenia (HCC)   . Seizure disorder [facility])    History reviewed. No pertinent surgical history. Family History: History reviewed. No pertinent family history. Family Psychiatric  History: Unaware Social History:  Social History   Substance and Sexual Activity  Alcohol Use No     Social History   Substance and Sexual Activity   Drug Use Not on file    Social History   Socioeconomic History  . Marital status: Single    Spouse name: Not on file  . Number of children: Not on file  . Years of education: Not on file  . Highest education level: Not on file  Occupational History  . Not on file  Tobacco Use  . Smoking status: Never Smoker  Substance and Sexual Activity  . Alcohol use: No  . Drug use: Not on file  . Sexual activity: Not on file  Other Topics Concern  . Not on file  Social History Narrative  . Not on file   Social Determinants of Health   Financial Resource Strain:   . Difficulty of Paying Living Expenses: Not on file  Food Insecurity:   . Worried About Programme researcher, broadcasting/film/video in the Last Year: Not on file  . Ran Out of Food in the Last Year: Not on file  Transportation Needs:   . Lack of Transportation (Medical): Not on file  . Lack of Transportation (Non-Medical): Not on file  Physical Activity:   . Days of Exercise per Week: Not on file  . Minutes of Exercise per Session: Not on file  Stress:   . Feeling of Stress : Not on file  Social Connections:   . Frequency of Communication with Friends and Family: Not on file  . Frequency of Social Gatherings with Friends and Family: Not on file  . Attends Religious Services: Not on file  . Active Member of Clubs or Organizations: Not on file  . Attends Banker  Meetings: Not on file  . Marital Status: Not on file    Has this patient used any form of tobacco in the last 30 days? (Cigarettes, Smokeless Tobacco, Cigars, and/or Pipes) Prescription not provided because: Patient does not use tobacco products  Current Medications: Current Facility-Administered Medications  Medication Dose Route Frequency Provider Last Rate Last Admin  . benztropine (COGENTIN) tablet 2 mg  2 mg Oral QHS Akintayo, Mojeed, MD   2 mg at 02/10/19 2112  . busPIRone (BUSPAR) tablet 5 mg  5 mg Oral TID Donnetta Hutching, MD   5 mg at 02/11/19 1014  . cloZAPine  (CLOZARIL) tablet 200 mg  200 mg Oral Daily Akintayo, Mojeed, MD   200 mg at 02/11/19 1014  . cloZAPine (CLOZARIL) tablet 200 mg  200 mg Oral QHS Akintayo, Mojeed, MD   200 mg at 02/10/19 2114  . divalproex (DEPAKOTE ER) 24 hr tablet 1,000 mg  1,000 mg Oral QHS Donnetta Hutching, MD   1,000 mg at 02/10/19 2113  . docusate sodium (COLACE) capsule 100 mg  100 mg Oral Daily PRN Donnetta Hutching, MD      . hydrOXYzine (ATARAX/VISTARIL) tablet 25 mg  25 mg Oral QID PRN Donnetta Hutching, MD      . levETIRAcetam (KEPPRA) tablet 500 mg  500 mg Oral BID Donnetta Hutching, MD   500 mg at 02/11/19 1014  . loratadine (CLARITIN) tablet 10 mg  10 mg Oral Daily PRN Donnetta Hutching, MD      . pantoprazole (PROTONIX) EC tablet 40 mg  40 mg Oral Daily Donnetta Hutching, MD   40 mg at 02/11/19 1014   Current Outpatient Medications  Medication Sig Dispense Refill  . benztropine (COGENTIN) 1 MG tablet Take 2 mg by mouth at bedtime.     . busPIRone (BUSPAR) 5 MG tablet Take 5 mg by mouth 3 (three) times daily.    . divalproex (DEPAKOTE ER) 500 MG 24 hr tablet Take 1,000 mg by mouth at bedtime.     . docusate sodium (COLACE) 100 MG capsule Take 100 mg by mouth daily as needed for mild constipation.    . hydrOXYzine (ATARAX/VISTARIL) 25 MG tablet Take 25 mg by mouth 4 (four) times daily as needed for anxiety.    . levETIRAcetam (KEPPRA) 500 MG tablet Take 1 tablet (500 mg total) by mouth 2 (two) times daily. 60 tablet 0  . loratadine (CLARITIN) 10 MG tablet Take 10 mg by mouth daily as needed for allergies.    . cloZAPine (CLOZARIL) 200 MG tablet Take 1 tablet (200 mg total) by mouth 2 (two) times daily. Patient to take 200 mg in morning and 200 mg at bed time 60 tablet 0  . omeprazole (PRILOSEC) 20 MG capsule Take 2 capsules (40 mg total) by mouth daily. (Patient not taking: Reported on 02/08/2019) 30 capsule 0   PTA Medications: (Not in a hospital admission)   Musculoskeletal: Strength & Muscle Tone: within normal limits Gait & Station:  normal Patient leans: N/A  Psychiatric Specialty Exam: Physical Exam Vitals and nursing note reviewed.  Constitutional:      Appearance: Normal appearance.  Pulmonary:     Effort: Pulmonary effort is normal.  Neurological:     Mental Status: He is alert.  Psychiatric:        Attention and Perception: Attention and perception normal.        Speech: Speech normal.        Behavior: Behavior normal. Behavior is cooperative.  Thought Content: Thought content is not paranoid or delusional. Thought content does not include homicidal or suicidal ideation.     Review of Systems  Psychiatric/Behavioral: Negative for agitation, behavioral problems, decreased concentration, self-injury, sleep disturbance and suicidal ideas. Hallucinations: Patient states that auditory hallucinations are much better and quitter. The patient is not nervous/anxious.   All other systems reviewed and are negative.   Blood pressure 124/81, pulse (!) 109, temperature 98.6 F (37 C), temperature source Oral, resp. rate 18, SpO2 96 %.There is no height or weight on file to calculate BMI.  General Appearance: Casual  Eye Contact:  Good  Speech:  Clear and Coherent and Normal Rate  Volume:  Normal  Mood:  "Pretty good"  Affect:  Appropriate and Congruent  Thought Process:  Coherent and Goal Directed  Orientation:  Full (Time, Place, and Person)  Thought Content:  Hallucinations: Auditory at baseline patient states he is doing much better   Suicidal Thoughts:  No  Homicidal Thoughts:  No  Memory:  Immediate;   Fair Recent;   Fair  Judgement:  Intact  Insight:  Present  Psychomotor Activity:  Normal  Concentration:  Concentration: Good and Attention Span: Good  Recall:  [employer] of Knowledge:  Fair  Language:  Fair  Akathisia:  No  Handed:  Right  AIMS (if indicated):     Assets:  Communication Skills Desire for Improvement Housing Social Support  ADL's:  Intact  Cognition:  WNL  Sleep:         Demographic Factors:  Male  Loss Factors: NA  Historical Factors: NA  Risk Reduction Factors:   Religious beliefs about death, Living with another person, especially a relative and Positive social support  Continued Clinical Symptoms:  Previous Psychiatric Diagnoses and Treatments  Cognitive Features That Contribute To Risk:  Loss of executive function    Suicide Risk:  Minimal: No identifiable suicidal ideation.  Patients presenting with no risk factors but with morbid ruminations; may be classified as minimal risk based on the severity of the depressive symptoms   Plan Of Care/Follow-up recommendations:   Social work consult ordered (to contact group home to pick up from hospital.    Activity:  As tolerated Diet:  Heart healthy    Discharge Instructions     For your behavioral health needs, you are advised to continue treatment with [facility]:       [facility]      [address], [place] [address]      [phone]      Crisis number: [phone]     Disposition:  Patient psychiatrically cleared No evidence of imminent risk to self or others at present.   Patient does not meet criteria for psychiatric inpatient admission. Supportive therapy provided about ongoing stressors. Discussed crisis plan, support from social network, calling 911, coming to the Emergency Department, and calling Suicide Hotline.   Spoke with EDP Dr. Ronnald Nian; discussed recommendations and disposition.   Blake Brazel, NP 02/11/2019, 2:03 PM

## 2019-02-12 DIAGNOSIS — Z03818 Encounter for observation for suspected exposure to other biological agents ruled out: Secondary | ICD-10-CM | POA: Diagnosis not present

## 2019-02-12 LAB — CLOZAPINE (CLOZARIL)
Clozapine Lvl: 809 ng/mL — ABNORMAL HIGH (ref 350–650)
NorClozapine: 127 ng/mL
Total(Cloz+Norcloz): 936 ng/mL

## 2019-02-13 DIAGNOSIS — Z79899 Other long term (current) drug therapy: Secondary | ICD-10-CM | POA: Diagnosis not present

## 2019-02-16 DIAGNOSIS — R69 Illness, unspecified: Secondary | ICD-10-CM | POA: Diagnosis not present

## 2019-02-16 DIAGNOSIS — F200 Paranoid schizophrenia: Secondary | ICD-10-CM | POA: Diagnosis not present

## 2019-02-16 DIAGNOSIS — G3184 Mild cognitive impairment, so stated: Secondary | ICD-10-CM | POA: Diagnosis not present

## 2019-02-20 DIAGNOSIS — Z20822 Contact with and (suspected) exposure to covid-19: Secondary | ICD-10-CM | POA: Diagnosis not present

## 2019-02-20 DIAGNOSIS — Z79899 Other long term (current) drug therapy: Secondary | ICD-10-CM | POA: Diagnosis not present

## 2019-02-25 DIAGNOSIS — N3281 Overactive bladder: Secondary | ICD-10-CM | POA: Diagnosis not present

## 2019-02-26 DIAGNOSIS — N3281 Overactive bladder: Secondary | ICD-10-CM | POA: Diagnosis not present

## 2019-02-27 DIAGNOSIS — Z87891 Personal history of nicotine dependence: Secondary | ICD-10-CM | POA: Diagnosis not present

## 2019-02-27 DIAGNOSIS — R251 Tremor, unspecified: Secondary | ICD-10-CM | POA: Diagnosis not present

## 2019-02-27 DIAGNOSIS — F4323 Adjustment disorder with mixed anxiety and depressed mood: Secondary | ICD-10-CM | POA: Diagnosis not present

## 2019-02-27 DIAGNOSIS — R69 Illness, unspecified: Secondary | ICD-10-CM | POA: Diagnosis not present

## 2019-02-27 DIAGNOSIS — K5900 Constipation, unspecified: Secondary | ICD-10-CM | POA: Diagnosis not present

## 2019-02-27 DIAGNOSIS — N3281 Overactive bladder: Secondary | ICD-10-CM | POA: Diagnosis not present

## 2019-02-28 DIAGNOSIS — N3281 Overactive bladder: Secondary | ICD-10-CM | POA: Diagnosis not present

## 2019-03-01 DIAGNOSIS — N3281 Overactive bladder: Secondary | ICD-10-CM | POA: Diagnosis not present

## 2019-03-02 DIAGNOSIS — N3281 Overactive bladder: Secondary | ICD-10-CM | POA: Diagnosis not present

## 2019-03-03 DIAGNOSIS — N3281 Overactive bladder: Secondary | ICD-10-CM | POA: Diagnosis not present

## 2019-03-04 DIAGNOSIS — N3281 Overactive bladder: Secondary | ICD-10-CM | POA: Diagnosis not present

## 2019-03-04 DIAGNOSIS — Z20822 Contact with and (suspected) exposure to covid-19: Secondary | ICD-10-CM | POA: Diagnosis not present

## 2019-03-05 DIAGNOSIS — Z79899 Other long term (current) drug therapy: Secondary | ICD-10-CM | POA: Diagnosis not present

## 2019-03-05 DIAGNOSIS — N3281 Overactive bladder: Secondary | ICD-10-CM | POA: Diagnosis not present

## 2019-03-06 DIAGNOSIS — N3281 Overactive bladder: Secondary | ICD-10-CM | POA: Diagnosis not present

## 2019-03-07 DIAGNOSIS — N3281 Overactive bladder: Secondary | ICD-10-CM | POA: Diagnosis not present

## 2019-03-08 DIAGNOSIS — N3281 Overactive bladder: Secondary | ICD-10-CM | POA: Diagnosis not present

## 2019-03-09 DIAGNOSIS — N3281 Overactive bladder: Secondary | ICD-10-CM | POA: Diagnosis not present

## 2019-03-10 DIAGNOSIS — N3281 Overactive bladder: Secondary | ICD-10-CM | POA: Diagnosis not present

## 2019-03-11 DIAGNOSIS — N3281 Overactive bladder: Secondary | ICD-10-CM | POA: Diagnosis not present

## 2019-03-12 DIAGNOSIS — N3281 Overactive bladder: Secondary | ICD-10-CM | POA: Diagnosis not present

## 2019-03-13 DIAGNOSIS — N3281 Overactive bladder: Secondary | ICD-10-CM | POA: Diagnosis not present

## 2019-03-14 DIAGNOSIS — N3281 Overactive bladder: Secondary | ICD-10-CM | POA: Diagnosis not present

## 2019-03-15 DIAGNOSIS — N3281 Overactive bladder: Secondary | ICD-10-CM | POA: Diagnosis not present

## 2019-03-15 DIAGNOSIS — G3184 Mild cognitive impairment, so stated: Secondary | ICD-10-CM | POA: Diagnosis not present

## 2019-03-15 DIAGNOSIS — R69 Illness, unspecified: Secondary | ICD-10-CM | POA: Diagnosis not present

## 2019-03-15 DIAGNOSIS — F411 Generalized anxiety disorder: Secondary | ICD-10-CM | POA: Diagnosis not present

## 2019-03-16 DIAGNOSIS — N3281 Overactive bladder: Secondary | ICD-10-CM | POA: Diagnosis not present

## 2019-03-17 DIAGNOSIS — N3281 Overactive bladder: Secondary | ICD-10-CM | POA: Diagnosis not present

## 2019-03-18 DIAGNOSIS — N3281 Overactive bladder: Secondary | ICD-10-CM | POA: Diagnosis not present

## 2019-03-19 DIAGNOSIS — N3281 Overactive bladder: Secondary | ICD-10-CM | POA: Diagnosis not present

## 2019-03-20 DIAGNOSIS — Z79899 Other long term (current) drug therapy: Secondary | ICD-10-CM | POA: Diagnosis not present

## 2019-03-20 DIAGNOSIS — Z20822 Contact with and (suspected) exposure to covid-19: Secondary | ICD-10-CM | POA: Diagnosis not present

## 2019-03-20 DIAGNOSIS — N3281 Overactive bladder: Secondary | ICD-10-CM | POA: Diagnosis not present

## 2019-03-21 DIAGNOSIS — N3281 Overactive bladder: Secondary | ICD-10-CM | POA: Diagnosis not present

## 2019-03-22 DIAGNOSIS — N3281 Overactive bladder: Secondary | ICD-10-CM | POA: Diagnosis not present

## 2019-03-23 DIAGNOSIS — N3281 Overactive bladder: Secondary | ICD-10-CM | POA: Diagnosis not present

## 2019-03-24 DIAGNOSIS — N3281 Overactive bladder: Secondary | ICD-10-CM | POA: Diagnosis not present

## 2019-03-25 DIAGNOSIS — N3281 Overactive bladder: Secondary | ICD-10-CM | POA: Diagnosis not present

## 2019-03-27 DIAGNOSIS — Z79899 Other long term (current) drug therapy: Secondary | ICD-10-CM | POA: Diagnosis not present

## 2019-04-02 DIAGNOSIS — N3281 Overactive bladder: Secondary | ICD-10-CM | POA: Diagnosis not present

## 2019-04-03 DIAGNOSIS — Z20822 Contact with and (suspected) exposure to covid-19: Secondary | ICD-10-CM | POA: Diagnosis not present

## 2019-04-03 DIAGNOSIS — Z79899 Other long term (current) drug therapy: Secondary | ICD-10-CM | POA: Diagnosis not present

## 2019-04-10 DIAGNOSIS — N3281 Overactive bladder: Secondary | ICD-10-CM | POA: Diagnosis not present

## 2019-04-10 DIAGNOSIS — Z79899 Other long term (current) drug therapy: Secondary | ICD-10-CM | POA: Diagnosis not present

## 2019-04-17 DIAGNOSIS — Z79899 Other long term (current) drug therapy: Secondary | ICD-10-CM | POA: Diagnosis not present

## 2019-04-17 DIAGNOSIS — Z20822 Contact with and (suspected) exposure to covid-19: Secondary | ICD-10-CM | POA: Diagnosis not present

## 2019-04-18 DIAGNOSIS — N3281 Overactive bladder: Secondary | ICD-10-CM | POA: Diagnosis not present

## 2019-04-22 DIAGNOSIS — E039 Hypothyroidism, unspecified: Secondary | ICD-10-CM | POA: Diagnosis not present

## 2019-04-22 DIAGNOSIS — E559 Vitamin D deficiency, unspecified: Secondary | ICD-10-CM | POA: Diagnosis not present

## 2019-04-22 DIAGNOSIS — I10 Essential (primary) hypertension: Secondary | ICD-10-CM | POA: Diagnosis not present

## 2019-04-24 DIAGNOSIS — Z79899 Other long term (current) drug therapy: Secondary | ICD-10-CM | POA: Diagnosis not present

## 2019-04-25 DIAGNOSIS — N3281 Overactive bladder: Secondary | ICD-10-CM | POA: Diagnosis not present

## 2019-05-02 DIAGNOSIS — Z79899 Other long term (current) drug therapy: Secondary | ICD-10-CM | POA: Diagnosis not present

## 2019-05-09 DIAGNOSIS — Z79899 Other long term (current) drug therapy: Secondary | ICD-10-CM | POA: Diagnosis not present

## 2019-05-15 DIAGNOSIS — Z79899 Other long term (current) drug therapy: Secondary | ICD-10-CM | POA: Diagnosis not present

## 2019-05-22 DIAGNOSIS — Z79899 Other long term (current) drug therapy: Secondary | ICD-10-CM | POA: Diagnosis not present

## 2019-05-23 DIAGNOSIS — K5901 Slow transit constipation: Secondary | ICD-10-CM | POA: Diagnosis not present

## 2019-05-23 DIAGNOSIS — G4089 Other seizures: Secondary | ICD-10-CM | POA: Diagnosis not present

## 2019-05-23 DIAGNOSIS — K219 Gastro-esophageal reflux disease without esophagitis: Secondary | ICD-10-CM | POA: Diagnosis not present

## 2019-05-25 DIAGNOSIS — N3281 Overactive bladder: Secondary | ICD-10-CM | POA: Diagnosis not present

## 2019-05-30 DIAGNOSIS — Z79899 Other long term (current) drug therapy: Secondary | ICD-10-CM | POA: Diagnosis not present

## 2019-06-06 DIAGNOSIS — Z79899 Other long term (current) drug therapy: Secondary | ICD-10-CM | POA: Diagnosis not present

## 2019-06-07 DIAGNOSIS — R69 Illness, unspecified: Secondary | ICD-10-CM | POA: Diagnosis not present

## 2019-06-07 DIAGNOSIS — F411 Generalized anxiety disorder: Secondary | ICD-10-CM | POA: Diagnosis not present

## 2019-06-07 DIAGNOSIS — G3184 Mild cognitive impairment, so stated: Secondary | ICD-10-CM | POA: Diagnosis not present

## 2019-06-13 DIAGNOSIS — Z79899 Other long term (current) drug therapy: Secondary | ICD-10-CM | POA: Diagnosis not present

## 2019-06-20 DIAGNOSIS — Z79899 Other long term (current) drug therapy: Secondary | ICD-10-CM | POA: Diagnosis not present

## 2019-06-21 DIAGNOSIS — I10 Essential (primary) hypertension: Secondary | ICD-10-CM | POA: Diagnosis not present

## 2019-06-21 DIAGNOSIS — E039 Hypothyroidism, unspecified: Secondary | ICD-10-CM | POA: Diagnosis not present

## 2019-06-21 DIAGNOSIS — K219 Gastro-esophageal reflux disease without esophagitis: Secondary | ICD-10-CM | POA: Diagnosis not present

## 2019-06-25 DIAGNOSIS — N3281 Overactive bladder: Secondary | ICD-10-CM | POA: Diagnosis not present

## 2019-06-27 DIAGNOSIS — Z79899 Other long term (current) drug therapy: Secondary | ICD-10-CM | POA: Diagnosis not present

## 2019-07-04 DIAGNOSIS — Z79899 Other long term (current) drug therapy: Secondary | ICD-10-CM | POA: Diagnosis not present

## 2019-07-10 DIAGNOSIS — Z79899 Other long term (current) drug therapy: Secondary | ICD-10-CM | POA: Diagnosis not present

## 2019-07-18 DIAGNOSIS — Z79899 Other long term (current) drug therapy: Secondary | ICD-10-CM | POA: Diagnosis not present

## 2019-07-19 DIAGNOSIS — K219 Gastro-esophageal reflux disease without esophagitis: Secondary | ICD-10-CM | POA: Diagnosis not present

## 2019-07-19 DIAGNOSIS — I10 Essential (primary) hypertension: Secondary | ICD-10-CM | POA: Diagnosis not present

## 2019-07-19 DIAGNOSIS — E039 Hypothyroidism, unspecified: Secondary | ICD-10-CM | POA: Diagnosis not present

## 2019-07-24 DIAGNOSIS — F411 Generalized anxiety disorder: Secondary | ICD-10-CM | POA: Diagnosis not present

## 2019-07-24 DIAGNOSIS — R69 Illness, unspecified: Secondary | ICD-10-CM | POA: Diagnosis not present

## 2019-07-24 DIAGNOSIS — G3184 Mild cognitive impairment, so stated: Secondary | ICD-10-CM | POA: Diagnosis not present

## 2019-07-25 DIAGNOSIS — Z79899 Other long term (current) drug therapy: Secondary | ICD-10-CM | POA: Diagnosis not present

## 2019-07-25 DIAGNOSIS — N3281 Overactive bladder: Secondary | ICD-10-CM | POA: Diagnosis not present

## 2019-08-01 DIAGNOSIS — R69 Illness, unspecified: Secondary | ICD-10-CM | POA: Diagnosis not present

## 2019-08-01 DIAGNOSIS — F419 Anxiety disorder, unspecified: Secondary | ICD-10-CM | POA: Diagnosis not present

## 2019-08-01 DIAGNOSIS — F200 Paranoid schizophrenia: Secondary | ICD-10-CM | POA: Diagnosis not present

## 2019-08-16 ENCOUNTER — Other Ambulatory Visit (HOSPITAL_COMMUNITY)
Admission: RE | Admit: 2019-08-16 | Discharge: 2019-08-16 | Disposition: A | Discharge status: Home / Self Care | Payer: Medicare HMO | Source: Other Acute Inpatient Hospital | Attending: Family Medicine | Admitting: Family Medicine

## 2019-08-16 DIAGNOSIS — Z79899 Other long term (current) drug therapy: Secondary | ICD-10-CM | POA: Insufficient documentation

## 2019-08-16 DIAGNOSIS — F209 Schizophrenia, unspecified: Secondary | ICD-10-CM | POA: Diagnosis present

## 2019-08-16 DIAGNOSIS — R69 Illness, unspecified: Secondary | ICD-10-CM | POA: Diagnosis not present

## 2019-08-16 LAB — CBC WITH DIFFERENTIAL/PLATELET
Abs Immature Granulocytes: 0.02 10*3/uL (ref 0.00–0.07)
Basophils Absolute: 0 10*3/uL (ref 0.0–0.1)
Basophils Relative: 0 %
Eosinophils Absolute: 0.1 10*3/uL (ref 0.0–0.5)
Eosinophils Relative: 1 %
HCT: 41.5 % (ref 39.0–52.0)
Hemoglobin: 13.4 g/dL (ref 13.0–17.0)
Immature Granulocytes: 0 %
Lymphocytes Relative: 42 %
Lymphs Abs: 2.3 10*3/uL (ref 0.7–4.0)
MCH: 31 pg (ref 26.0–34.0)
MCHC: 32.3 g/dL (ref 30.0–36.0)
MCV: 96.1 fL (ref 80.0–100.0)
Monocytes Absolute: 0.6 10*3/uL (ref 0.1–1.0)
Monocytes Relative: 10 %
Neutro Abs: 2.5 10*3/uL (ref 1.7–7.7)
Neutrophils Relative %: 47 %
Platelets: 202 10*3/uL (ref 150–400)
RBC: 4.32 MIL/uL (ref 4.22–5.81)
RDW: 13.9 % (ref 11.5–15.5)
WBC: 5.5 10*3/uL (ref 4.0–10.5)
nRBC: 0 % (ref 0.0–0.2)

## 2019-08-21 ENCOUNTER — Telehealth: Payer: Self-pay | Admitting: Family Medicine

## 2019-08-21 DIAGNOSIS — Z79899 Other long term (current) drug therapy: Secondary | ICD-10-CM | POA: Diagnosis not present

## 2019-08-21 DIAGNOSIS — R69 Illness, unspecified: Secondary | ICD-10-CM | POA: Diagnosis not present

## 2019-08-21 NOTE — Telephone Encounter (Signed)
Patient was called regarding crm. Patient stated that she was informed [facility] lab could not do blood draw due to him not being a patient of clinic.    Copied from CRM [id number]. Topic: General - Inquiry >> Aug 21, 2019 10:14 AM Wyonia Hough E wrote: Reason for CRM: [facility] and he was advised by Selena Batten at the hospital to call office to get blood drawn asap today/ Pt mom states they can not get a medication until hes had labs done/ please advise

## 2019-08-28 DIAGNOSIS — R69 Illness, unspecified: Secondary | ICD-10-CM | POA: Diagnosis not present

## 2019-08-28 DIAGNOSIS — Z79899 Other long term (current) drug therapy: Secondary | ICD-10-CM | POA: Diagnosis not present

## 2019-09-04 DIAGNOSIS — R69 Illness, unspecified: Secondary | ICD-10-CM | POA: Diagnosis not present

## 2019-09-04 DIAGNOSIS — Z79899 Other long term (current) drug therapy: Secondary | ICD-10-CM | POA: Diagnosis not present

## 2019-09-11 DIAGNOSIS — Z79899 Other long term (current) drug therapy: Secondary | ICD-10-CM | POA: Diagnosis not present

## 2019-09-11 DIAGNOSIS — R69 Illness, unspecified: Secondary | ICD-10-CM | POA: Diagnosis not present

## 2019-09-12 DIAGNOSIS — R69 Illness, unspecified: Secondary | ICD-10-CM | POA: Diagnosis not present

## 2019-09-17 DIAGNOSIS — Z79899 Other long term (current) drug therapy: Secondary | ICD-10-CM | POA: Diagnosis not present

## 2019-09-17 DIAGNOSIS — R69 Illness, unspecified: Secondary | ICD-10-CM | POA: Diagnosis not present

## 2019-09-19 ENCOUNTER — Ambulatory Visit (HOSPITAL_COMMUNITY): Payer: Medicare HMO | Admitting: Psychiatry

## 2019-09-19 ENCOUNTER — Encounter: Payer: Medicare HMO | Admitting: Internal Medicine

## 2019-09-24 DIAGNOSIS — R69 Illness, unspecified: Secondary | ICD-10-CM | POA: Diagnosis not present

## 2019-09-24 DIAGNOSIS — Z79899 Other long term (current) drug therapy: Secondary | ICD-10-CM | POA: Diagnosis not present

## 2019-10-01 DIAGNOSIS — Z79899 Other long term (current) drug therapy: Secondary | ICD-10-CM | POA: Diagnosis not present

## 2019-10-01 DIAGNOSIS — R69 Illness, unspecified: Secondary | ICD-10-CM | POA: Diagnosis not present

## 2019-10-08 DIAGNOSIS — R69 Illness, unspecified: Secondary | ICD-10-CM | POA: Diagnosis not present

## 2019-10-08 DIAGNOSIS — Z79899 Other long term (current) drug therapy: Secondary | ICD-10-CM | POA: Diagnosis not present

## 2019-10-14 DIAGNOSIS — Z79899 Other long term (current) drug therapy: Secondary | ICD-10-CM | POA: Diagnosis not present

## 2019-10-14 DIAGNOSIS — R69 Illness, unspecified: Secondary | ICD-10-CM | POA: Diagnosis not present

## 2019-10-21 DIAGNOSIS — R69 Illness, unspecified: Secondary | ICD-10-CM | POA: Diagnosis not present

## 2019-10-21 DIAGNOSIS — Z79899 Other long term (current) drug therapy: Secondary | ICD-10-CM | POA: Diagnosis not present

## 2019-10-28 DIAGNOSIS — R69 Illness, unspecified: Secondary | ICD-10-CM | POA: Diagnosis not present

## 2019-10-28 DIAGNOSIS — Z79899 Other long term (current) drug therapy: Secondary | ICD-10-CM | POA: Diagnosis not present

## 2019-11-04 DIAGNOSIS — Z79899 Other long term (current) drug therapy: Secondary | ICD-10-CM | POA: Diagnosis not present

## 2019-11-04 DIAGNOSIS — R69 Illness, unspecified: Secondary | ICD-10-CM | POA: Diagnosis not present

## 2019-11-11 DIAGNOSIS — Z79899 Other long term (current) drug therapy: Secondary | ICD-10-CM | POA: Diagnosis not present

## 2019-11-11 DIAGNOSIS — R69 Illness, unspecified: Secondary | ICD-10-CM | POA: Diagnosis not present

## 2019-11-18 DIAGNOSIS — R69 Illness, unspecified: Secondary | ICD-10-CM | POA: Diagnosis not present

## 2019-11-18 DIAGNOSIS — Z79899 Other long term (current) drug therapy: Secondary | ICD-10-CM | POA: Diagnosis not present

## 2019-11-25 DIAGNOSIS — R69 Illness, unspecified: Secondary | ICD-10-CM | POA: Diagnosis not present

## 2019-11-25 DIAGNOSIS — Z79899 Other long term (current) drug therapy: Secondary | ICD-10-CM | POA: Diagnosis not present

## 2019-11-28 DIAGNOSIS — R69 Illness, unspecified: Secondary | ICD-10-CM | POA: Diagnosis not present

## 2019-11-28 DIAGNOSIS — F419 Anxiety disorder, unspecified: Secondary | ICD-10-CM | POA: Diagnosis not present

## 2019-12-02 DIAGNOSIS — Z79899 Other long term (current) drug therapy: Secondary | ICD-10-CM | POA: Diagnosis not present

## 2019-12-02 DIAGNOSIS — R69 Illness, unspecified: Secondary | ICD-10-CM | POA: Diagnosis not present

## 2019-12-09 DIAGNOSIS — Z79899 Other long term (current) drug therapy: Secondary | ICD-10-CM | POA: Diagnosis not present

## 2019-12-09 DIAGNOSIS — R69 Illness, unspecified: Secondary | ICD-10-CM | POA: Diagnosis not present

## 2019-12-16 DIAGNOSIS — R69 Illness, unspecified: Secondary | ICD-10-CM | POA: Diagnosis not present

## 2019-12-16 DIAGNOSIS — Z79899 Other long term (current) drug therapy: Secondary | ICD-10-CM | POA: Diagnosis not present

## 2019-12-23 DIAGNOSIS — R69 Illness, unspecified: Secondary | ICD-10-CM | POA: Diagnosis not present

## 2019-12-23 DIAGNOSIS — Z79899 Other long term (current) drug therapy: Secondary | ICD-10-CM | POA: Diagnosis not present

## 2019-12-30 DIAGNOSIS — R69 Illness, unspecified: Secondary | ICD-10-CM | POA: Diagnosis not present

## 2019-12-30 DIAGNOSIS — Z79899 Other long term (current) drug therapy: Secondary | ICD-10-CM | POA: Diagnosis not present

## 2020-01-06 DIAGNOSIS — R69 Illness, unspecified: Secondary | ICD-10-CM | POA: Diagnosis not present

## 2020-01-06 DIAGNOSIS — Z79899 Other long term (current) drug therapy: Secondary | ICD-10-CM | POA: Diagnosis not present

## 2020-01-13 ENCOUNTER — Encounter: Payer: Medicare HMO | Admitting: Student

## 2020-01-13 DIAGNOSIS — R69 Illness, unspecified: Secondary | ICD-10-CM | POA: Diagnosis not present

## 2020-01-13 DIAGNOSIS — Z79899 Other long term (current) drug therapy: Secondary | ICD-10-CM | POA: Diagnosis not present

## 2020-01-15 ENCOUNTER — Encounter: Payer: Medicare HMO | Admitting: Internal Medicine

## 2020-01-20 DIAGNOSIS — Z79899 Other long term (current) drug therapy: Secondary | ICD-10-CM | POA: Diagnosis not present

## 2020-01-20 DIAGNOSIS — R69 Illness, unspecified: Secondary | ICD-10-CM | POA: Diagnosis not present

## 2020-01-26 DIAGNOSIS — R69 Illness, unspecified: Secondary | ICD-10-CM | POA: Diagnosis not present

## 2020-01-27 DIAGNOSIS — R69 Illness, unspecified: Secondary | ICD-10-CM | POA: Diagnosis not present

## 2020-01-27 DIAGNOSIS — Z79899 Other long term (current) drug therapy: Secondary | ICD-10-CM | POA: Diagnosis not present

## 2020-02-04 DIAGNOSIS — R69 Illness, unspecified: Secondary | ICD-10-CM | POA: Diagnosis not present

## 2020-02-04 DIAGNOSIS — Z79899 Other long term (current) drug therapy: Secondary | ICD-10-CM | POA: Diagnosis not present

## 2020-02-06 ENCOUNTER — Encounter: Payer: Medicare HMO | Admitting: Internal Medicine

## 2020-02-11 DIAGNOSIS — Z79899 Other long term (current) drug therapy: Secondary | ICD-10-CM | POA: Diagnosis not present

## 2020-02-11 DIAGNOSIS — R69 Illness, unspecified: Secondary | ICD-10-CM | POA: Diagnosis not present

## 2020-02-13 DIAGNOSIS — R69 Illness, unspecified: Secondary | ICD-10-CM | POA: Diagnosis not present

## 2020-02-17 DIAGNOSIS — Z79899 Other long term (current) drug therapy: Secondary | ICD-10-CM | POA: Diagnosis not present

## 2020-02-17 DIAGNOSIS — R69 Illness, unspecified: Secondary | ICD-10-CM | POA: Diagnosis not present

## 2020-02-20 ENCOUNTER — Ambulatory Visit (INDEPENDENT_AMBULATORY_CARE_PROVIDER_SITE_OTHER): Payer: Medicare HMO | Admitting: Internal Medicine

## 2020-02-20 VITALS — BP 126/84 | HR 116 | Temp 98.6°F | Ht 67.0 in | Wt 135.2 lb

## 2020-02-20 DIAGNOSIS — Z0000 Encounter for general adult medical examination without abnormal findings: Secondary | ICD-10-CM | POA: Diagnosis not present

## 2020-02-20 DIAGNOSIS — R399 Unspecified symptoms and signs involving the genitourinary system: Secondary | ICD-10-CM

## 2020-02-20 DIAGNOSIS — Z23 Encounter for immunization: Secondary | ICD-10-CM | POA: Diagnosis not present

## 2020-02-20 DIAGNOSIS — R739 Hyperglycemia, unspecified: Secondary | ICD-10-CM

## 2020-02-20 DIAGNOSIS — F209 Schizophrenia, unspecified: Secondary | ICD-10-CM

## 2020-02-20 DIAGNOSIS — R7303 Prediabetes: Secondary | ICD-10-CM

## 2020-02-20 DIAGNOSIS — R69 Illness, unspecified: Secondary | ICD-10-CM | POA: Diagnosis not present

## 2020-02-20 LAB — POCT GLYCOSYLATED HEMOGLOBIN (HGB A1C): Hemoglobin A1C: 6.1 % — AB (ref 4.0–5.6)

## 2020-02-20 LAB — GLUCOSE, CAPILLARY: Glucose-Capillary: 74 mg/dL (ref 70–99)

## 2020-02-20 NOTE — Patient Instructions (Signed)
Thank you, [name] for allowing [place] to provide your care today. Today we discussed Frequent urination.    I have ordered the following labs for you:   Lab Orders     BMP8+Anion Gap     Lipid Profile     PSA     Urinalysis, Reflex Microscopic     POC Hbg A1C   Tests ordered today:  Post void residual - bladder scan Abdominal US   Referrals ordered today:   Referral Orders  No referral(s) requested today     I have ordered the following medication/changed the following medications:   Stop the following medications: There are no discontinued medications.   Start the following medications: No orders of the defined types were placed in this encounter.    Follow up: 4-6 months    Should you have any questions or concerns please call the internal medicine clinic at [phone].     Blake Long, D.O. [facility]

## 2020-02-20 NOTE — Assessment & Plan Note (Addendum)
Patient has a history of schizophrenia and was previously living at the [facility] and now living with his mother in [place]. Currently being managed by Dr. Teena Irani at [facility].    Plan: - Continue medication management through psychiatry.

## 2020-02-20 NOTE — Progress Notes (Signed)
CC: Lower urinary tract symtpoms  HPI:  [name] is a 45 y.o. male with a past medical history stated below and presents today for LUTS. Please see problem based assessment and plan for additional details.  Past Medical History:  Diagnosis Date  . Anxiety   . Cognitive impairment   . GERD (gastroesophageal reflux disease)   . Hyperglycemia   . Hypertension   . Overactive bladder   . Paranoid disorder (HCC)   . Schizophrenia (HCC)   . Seizure disorder [facility])     Current Outpatient Medications on File Prior to Visit  Medication Sig Dispense Refill  . benztropine (COGENTIN) 1 MG tablet Take 2 mg by mouth at bedtime.     . busPIRone (BUSPAR) 5 MG tablet Take 5 mg by mouth 3 (three) times daily.    . busPIRone (BUSPAR) 7.5 MG tablet Take 7.5 mg by mouth 3 (three) times daily.    . cloZAPine (CLOZARIL) 200 MG tablet Take 1 tablet (200 mg total) by mouth 2 (two) times daily. Patient to take 200 mg in morning and 200 mg at bed time 60 tablet 0  . divalproex (DEPAKOTE ER) 500 MG 24 hr tablet Take 1,000 mg by mouth at bedtime.     . docusate sodium (COLACE) 100 MG capsule Take 100 mg by mouth daily as needed for mild constipation.    . hydrOXYzine (ATARAX/VISTARIL) 25 MG tablet Take 25 mg by mouth 4 (four) times daily as needed for anxiety.    Marland Kitchen loratadine (CLARITIN) 10 MG tablet Take 10 mg by mouth daily as needed for allergies.    Marland Kitchen omeprazole (PRILOSEC) 20 MG capsule Take 2 capsules (40 mg total) by mouth daily. (Patient not taking: Reported on 02/08/2019) 30 capsule 0  . traZODone (DESYREL) 50 MG tablet      No current facility-administered medications on file prior to visit.    No family history on file.  Social History   Socioeconomic History  . Marital status: Single    Spouse name: Not on file  . Number of children: Not on file  . Years of education: Not on file  . Highest education level: Not on file  Occupational History  . Not on file  Tobacco Use  .  Smoking status: Never Smoker  . Smokeless tobacco: Not on file  Substance and Sexual Activity  . Alcohol use: No  . Drug use: Not on file  . Sexual activity: Not on file  Other Topics Concern  . Not on file  Social History Narrative  . Not on file   Social Determinants of Health   Financial Resource Strain: Not on file  Food Insecurity: Not on file  Transportation Needs: Not on file  Physical Activity: Not on file  Stress: Not on file  Social Connections: Not on file  Intimate Partner Violence: Not on file    Review of Systems: ROS negative except for what is noted on the assessment and plan.  Vitals:   02/20/20 1542  BP: 126/84  Pulse: (!) 116  Temp: 98.6 F (37 C)  TempSrc: Oral  SpO2: 99%  Weight: 135 lb 3.2 oz (61.3 kg)  Height: 5\' 7"  (1.702 m)     Physical Exam: Physical Exam Constitutional:      Appearance: Normal appearance.  HENT:     Head: Normocephalic and atraumatic.  Eyes:     Extraocular Movements: Extraocular movements intact.  Cardiovascular:     Rate and Rhythm: Normal rate.  Pulses: Normal pulses.     Heart sounds: Normal heart sounds.  Pulmonary:     Effort: Pulmonary effort is normal.     Breath sounds: Normal breath sounds.  Abdominal:     General: Bowel sounds are normal.     Palpations: Abdomen is soft.     Tenderness: There is no abdominal tenderness.  Musculoskeletal:        General: Normal range of motion.     Cervical back: Normal range of motion.     Right lower leg: No edema.     Left lower leg: No edema.  Skin:    General: Skin is warm and dry.  Neurological:     Mental Status: He is alert and oriented to person, place, and time. Mental status is at baseline.  Psychiatric:        Attention and Perception: He is inattentive.        Mood and Affect: Mood is anxious.        Behavior: Behavior is hyperactive. Behavior is cooperative.        Cognition and Memory: Cognition is impaired.        Judgment: Judgment is  impulsive.      Assessment & Plan:   See Encounters Tab for problem based charting.  Patient discussed with Dr. Jeral Pinch, D.O. [facility], PGY-2 Pager: [phone], Phone: [phone] Date 02/24/2020 Time 7:57 AM

## 2020-02-21 LAB — URINALYSIS, ROUTINE W REFLEX MICROSCOPIC
Bilirubin, UA: NEGATIVE
Glucose, UA: NEGATIVE
Leukocytes,UA: NEGATIVE
Nitrite, UA: NEGATIVE
Protein,UA: NEGATIVE
RBC, UA: NEGATIVE
Specific Gravity, UA: 1.019 (ref 1.005–1.030)
Urobilinogen, Ur: 0.2 mg/dL (ref 0.2–1.0)
pH, UA: 5 (ref 5.0–7.5)

## 2020-02-21 LAB — LIPID PANEL
Chol/HDL Ratio: 2.5 ratio (ref 0.0–5.0)
Cholesterol, Total: 145 mg/dL (ref 100–199)
HDL: 59 mg/dL (ref >39)
LDL Chol Calc (NIH): 74 mg/dL (ref 0–99)
Triglycerides: 57 mg/dL (ref 0–149)
VLDL Cholesterol Cal: 12 mg/dL (ref 5–40)

## 2020-02-21 LAB — BMP8+ANION GAP
Anion Gap: 13 mmol/L (ref 10.0–18.0)
BUN/Creatinine Ratio: 12 (ref 9–20)
BUN: 14 mg/dL (ref 6–24)
CO2: 25 mmol/L (ref 20–29)
Calcium: 9.8 mg/dL (ref 8.7–10.2)
Chloride: 102 mmol/L (ref 96–106)
Creatinine, Ser: 1.15 mg/dL (ref 0.76–1.27)
GFR calc Af Amer: 89 mL/min/{1.73_m2} (ref >59)
GFR calc non Af Amer: 77 mL/min/{1.73_m2} (ref >59)
Glucose: 77 mg/dL (ref 65–99)
Potassium: 5.2 mmol/L (ref 3.5–5.2)
Sodium: 140 mmol/L (ref 134–144)

## 2020-02-21 LAB — PSA: Prostate Specific Ag, Serum: 0.8 ng/mL (ref 0.0–4.0)

## 2020-02-24 ENCOUNTER — Encounter: Payer: Self-pay | Admitting: Internal Medicine

## 2020-02-24 ENCOUNTER — Encounter: Payer: Self-pay | Admitting: Dietician

## 2020-02-24 ENCOUNTER — Telehealth: Payer: Self-pay | Admitting: Dietician

## 2020-02-24 DIAGNOSIS — Z79899 Other long term (current) drug therapy: Secondary | ICD-10-CM | POA: Diagnosis not present

## 2020-02-24 DIAGNOSIS — R399 Unspecified symptoms and signs involving the genitourinary system: Secondary | ICD-10-CM | POA: Insufficient documentation

## 2020-02-24 DIAGNOSIS — N400 Enlarged prostate without lower urinary tract symptoms: Secondary | ICD-10-CM | POA: Insufficient documentation

## 2020-02-24 DIAGNOSIS — Z0000 Encounter for general adult medical examination without abnormal findings: Secondary | ICD-10-CM | POA: Insufficient documentation

## 2020-02-24 DIAGNOSIS — R7303 Prediabetes: Secondary | ICD-10-CM | POA: Insufficient documentation

## 2020-02-24 DIAGNOSIS — R69 Illness, unspecified: Secondary | ICD-10-CM | POA: Diagnosis not present

## 2020-02-24 NOTE — Assessment & Plan Note (Signed)
Flu shot given today. Lipid panel performed today.

## 2020-02-24 NOTE — Progress Notes (Signed)
Internal Medicine Clinic Attending  Case discussed with Dr. Coe  At the time of the visit.  We reviewed the resident's history and exam and pertinent patient test results.  I agree with the assessment, diagnosis, and plan of care documented in the resident's note.  

## 2020-02-24 NOTE — Telephone Encounter (Signed)
I reached out to this patient about the referral for counseling for his prediabetes. I spoke with his mother who identified him by name, date of birth and address. She would like for him to be referred to the [facility] office's Medicare Diabetes Prevention Program. She understands that it is currently a virtual program.

## 2020-02-24 NOTE — Assessment & Plan Note (Addendum)
Went through the patient's history, he has history of hyperglycemia.  Therefore I will get hemoglobin A1c and lipid panel to assess his risk of diabetes and cardiovascular disease.  Patient has a family history of cardiovascular disease and diabetes which increases his risk.  Otherwise, he lives a primarily sedentary life and denies ever smoking in the past.  Patient's blood pressure is 126/84 without medication management.  Plan: - A1c and Lipid panel   **Addedum**   Patient has a A1c of 6.1 which is in the prediabetes range. I counseled the family regarding lifestyle changes to prevent DM in the future. Furthermore, I will put in a referral for [facility] for further diabetic education.

## 2020-02-24 NOTE — Assessment & Plan Note (Addendum)
Patient presents today with lower urinary tract symptoms including frequent urination, nocturia, and incomplete voiding. Patient's mother is in the room with him and helps provide the history patient has a difficult time conveying the history of his present illness. Patient mother states that this has been going on for several years and she is concerned he may have diabetes. She states that he will go to the bathroom and then within a couple minutes need to go back to finish urinating. He will wake up several times throughout the night to urinate. Otherwise he denies any pain with urination, incontinence, hematuria, polydipsia, polyphagia, trauma to the perineal region, or recent infections.  Post void residual was performed today showing only 15 cc of retained urine in his bladder.  Hemoglobin A1c today was 6.1. Regardless, my differential includes benign prostatic hyperplasia versus iatrogenic/polypharmacy.  Plan: 1. Post-void residual was inconclusive regarding enlarged prostate. Therefore, I will order a limited abdominal US to determine prostate size and get a PSA level today.  2. Will perform a UA today 3.  If labs and imaging are normal then consider iatrogenic causes.  **Addendum**  PSA level was 0.8.  Abdominal ultrasound pending

## 2020-03-01 ENCOUNTER — Telehealth: Payer: Self-pay | Admitting: Internal Medicine

## 2020-03-01 DIAGNOSIS — R399 Unspecified symptoms and signs involving the genitourinary system: Secondary | ICD-10-CM

## 2020-03-01 NOTE — Telephone Encounter (Signed)
Attempted to call patients mother regarding patients LUTS and see if the patient has been able to schedule an appointment for the prostate [place]. He will need the [place] to confirm or r/o a dx of BPH and start medication therapy.   Chari Manning, D.O.  Internal Medicine Resident, PGY-2 [facility]  Pager: [phone] 10:16 AM, 03/01/2020

## 2020-03-03 DIAGNOSIS — R69 Illness, unspecified: Secondary | ICD-10-CM | POA: Diagnosis not present

## 2020-03-03 DIAGNOSIS — Z79899 Other long term (current) drug therapy: Secondary | ICD-10-CM | POA: Diagnosis not present

## 2020-03-09 ENCOUNTER — Ambulatory Visit (HOSPITAL_COMMUNITY): Payer: Medicare HMO

## 2020-03-10 DIAGNOSIS — R69 Illness, unspecified: Secondary | ICD-10-CM | POA: Diagnosis not present

## 2020-03-10 DIAGNOSIS — Z79899 Other long term (current) drug therapy: Secondary | ICD-10-CM | POA: Diagnosis not present

## 2020-03-12 ENCOUNTER — Encounter: Payer: Medicare HMO | Attending: Student in an Organized Health Care Education/Training Program | Admitting: Dietician

## 2020-03-12 NOTE — Addendum Note (Signed)
Addended by: Neomia Dear on: 03/12/2020 06:12 PM   Modules accepted: Orders

## 2020-03-16 DIAGNOSIS — Z79899 Other long term (current) drug therapy: Secondary | ICD-10-CM | POA: Diagnosis not present

## 2020-03-16 DIAGNOSIS — R69 Illness, unspecified: Secondary | ICD-10-CM | POA: Diagnosis not present

## 2020-03-23 DIAGNOSIS — R69 Illness, unspecified: Secondary | ICD-10-CM | POA: Diagnosis not present

## 2020-03-23 DIAGNOSIS — Z79899 Other long term (current) drug therapy: Secondary | ICD-10-CM | POA: Diagnosis not present

## 2020-03-30 DIAGNOSIS — R69 Illness, unspecified: Secondary | ICD-10-CM | POA: Diagnosis not present

## 2020-03-30 DIAGNOSIS — Z79899 Other long term (current) drug therapy: Secondary | ICD-10-CM | POA: Diagnosis not present

## 2020-04-02 ENCOUNTER — Other Ambulatory Visit: Payer: Self-pay

## 2020-04-02 ENCOUNTER — Other Ambulatory Visit: Payer: Self-pay | Admitting: Internal Medicine

## 2020-04-02 ENCOUNTER — Ambulatory Visit (HOSPITAL_COMMUNITY)
Admission: RE | Admit: 2020-04-02 | Discharge: 2020-04-02 | Disposition: A | Discharge status: Home / Self Care | Payer: Medicare HMO | Source: Ambulatory Visit | Attending: Internal Medicine | Admitting: Internal Medicine

## 2020-04-02 DIAGNOSIS — R339 Retention of urine, unspecified: Secondary | ICD-10-CM | POA: Diagnosis not present

## 2020-04-02 DIAGNOSIS — R399 Unspecified symptoms and signs involving the genitourinary system: Secondary | ICD-10-CM

## 2020-04-02 DIAGNOSIS — R338 Other retention of urine: Secondary | ICD-10-CM | POA: Insufficient documentation

## 2020-04-02 DIAGNOSIS — N401 Enlarged prostate with lower urinary tract symptoms: Secondary | ICD-10-CM | POA: Insufficient documentation

## 2020-04-02 NOTE — Telephone Encounter (Signed)
Reordered as renal/bladder ultrasound per radiology request to evaluate LUTS for possible urinary retention and BPH

## 2020-04-02 NOTE — Addendum Note (Signed)
Addended by: Anne Shutter on: 04/02/2020 11:02 AM   Modules accepted: Orders

## 2020-04-06 DIAGNOSIS — Z79899 Other long term (current) drug therapy: Secondary | ICD-10-CM | POA: Diagnosis not present

## 2020-04-06 DIAGNOSIS — R69 Illness, unspecified: Secondary | ICD-10-CM | POA: Diagnosis not present

## 2020-04-13 DIAGNOSIS — Z79899 Other long term (current) drug therapy: Secondary | ICD-10-CM | POA: Diagnosis not present

## 2020-04-13 DIAGNOSIS — R69 Illness, unspecified: Secondary | ICD-10-CM | POA: Diagnosis not present

## 2020-04-20 DIAGNOSIS — Z79899 Other long term (current) drug therapy: Secondary | ICD-10-CM | POA: Diagnosis not present

## 2020-04-20 DIAGNOSIS — R69 Illness, unspecified: Secondary | ICD-10-CM | POA: Diagnosis not present

## 2020-04-27 DIAGNOSIS — Z79899 Other long term (current) drug therapy: Secondary | ICD-10-CM | POA: Diagnosis not present

## 2020-04-27 DIAGNOSIS — R69 Illness, unspecified: Secondary | ICD-10-CM | POA: Diagnosis not present

## 2020-04-29 NOTE — Progress Notes (Signed)
Hello, can you please schedule this patient for a follow up appointment to discuss his result. Thank you.

## 2020-04-30 DIAGNOSIS — H40013 Open angle with borderline findings, low risk, bilateral: Secondary | ICD-10-CM | POA: Diagnosis not present

## 2020-04-30 DIAGNOSIS — H0288B Meibomian gland dysfunction left eye, upper and lower eyelids: Secondary | ICD-10-CM | POA: Diagnosis not present

## 2020-04-30 DIAGNOSIS — H52223 Regular astigmatism, bilateral: Secondary | ICD-10-CM | POA: Diagnosis not present

## 2020-04-30 DIAGNOSIS — H0288A Meibomian gland dysfunction right eye, upper and lower eyelids: Secondary | ICD-10-CM | POA: Diagnosis not present

## 2020-04-30 DIAGNOSIS — H5213 Myopia, bilateral: Secondary | ICD-10-CM | POA: Diagnosis not present

## 2020-05-04 DIAGNOSIS — R69 Illness, unspecified: Secondary | ICD-10-CM | POA: Diagnosis not present

## 2020-05-04 DIAGNOSIS — Z79899 Other long term (current) drug therapy: Secondary | ICD-10-CM | POA: Diagnosis not present

## 2020-05-05 DIAGNOSIS — R69 Illness, unspecified: Secondary | ICD-10-CM | POA: Diagnosis not present

## 2020-05-07 ENCOUNTER — Telehealth: Payer: Self-pay

## 2020-05-07 ENCOUNTER — Other Ambulatory Visit: Payer: Self-pay | Admitting: Internal Medicine

## 2020-05-07 DIAGNOSIS — N3943 Post-void dribbling: Secondary | ICD-10-CM

## 2020-05-07 DIAGNOSIS — R399 Unspecified symptoms and signs involving the genitourinary system: Secondary | ICD-10-CM

## 2020-05-07 DIAGNOSIS — N401 Enlarged prostate with lower urinary tract symptoms: Secondary | ICD-10-CM

## 2020-05-07 MED ORDER — TAMSULOSIN HCL 0.4 MG PO CAPS: 0.4000 mg | ORAL_CAPSULE | Freq: Every day | ORAL | 2 refills | Status: AC

## 2020-05-07 NOTE — Telephone Encounter (Signed)
RTC, Mother states her son had the renal/bladder US that Dr. Marchia Bond requested, that Dr. Marchia Bond tried calling her about the results, but she was not able to talk because she "was in a store". She is leaving town tomorrow and would like to know what the results were before she leaves town.  She has some questions.  Forwarding to Dr. Erasmo Score, RN,BSN

## 2020-05-07 NOTE — Progress Notes (Signed)
Called patient with results from his renal US. Shows signs of BPH. Will prescribe flomax 0.4 mg and follow up his response at his next appointment. Patient's mother counseled regarding the medication.  Chari Manning, D.O.  Internal Medicine Resident, PGY-2 [facility]  Pager: [phone] 7:53 PM, 05/07/2020

## 2020-05-07 NOTE — Telephone Encounter (Signed)
Pt mom is requesting  Call back

## 2020-05-12 DIAGNOSIS — Z79899 Other long term (current) drug therapy: Secondary | ICD-10-CM | POA: Diagnosis not present

## 2020-05-12 DIAGNOSIS — R69 Illness, unspecified: Secondary | ICD-10-CM | POA: Diagnosis not present

## 2020-05-14 ENCOUNTER — Ambulatory Visit (INDEPENDENT_AMBULATORY_CARE_PROVIDER_SITE_OTHER): Payer: Medicare HMO | Admitting: Internal Medicine

## 2020-05-14 VITALS — BP 121/81 | HR 107 | Temp 98.4°F | Wt 133.4 lb

## 2020-05-14 DIAGNOSIS — N401 Enlarged prostate with lower urinary tract symptoms: Secondary | ICD-10-CM

## 2020-05-14 DIAGNOSIS — R350 Frequency of micturition: Secondary | ICD-10-CM | POA: Diagnosis not present

## 2020-05-14 DIAGNOSIS — K5900 Constipation, unspecified: Secondary | ICD-10-CM

## 2020-05-14 MED ORDER — POLYETHYLENE GLYCOL 3350 17 G PO PACK: 17.0000 g | PACK | Freq: Every day | ORAL | 0 refills | Status: DC

## 2020-05-14 NOTE — Patient Instructions (Signed)
Thank you, Blake Long. for allowing [place] to provide your care today. Today we discussed constipation, enlarged prostate.    I have ordered the following labs for you:  Lab Orders  No laboratory test(s) ordered today     Tests ordered today:  none  Referrals ordered today:   Referral Orders  No referral(s) requested today     Medication Changes:   There are no discontinued medications.   Meds ordered this encounter  Medications  . polyethylene glycol (MIRALAX) 17 g packet    Sig: Take 17 g by mouth daily.    Dispense:  14 each    Refill:  0     Follow up: 6 months   Remember:    Should you have any questions or concerns please call the internal medicine clinic at [phone].     Dellia Cloud, D.O. [facility]

## 2020-05-14 NOTE — Progress Notes (Signed)
CC: BPH  HPI:  Blake Long. is a 45 y.o. male with a past medical history stated below and presents today for BPH. Please see problem based assessment and plan for additional details.  Past Medical History:  Diagnosis Date  . Anxiety   . Cognitive impairment   . GERD (gastroesophageal reflux disease)   . Hyperglycemia   . Hypertension   . Overactive bladder   . Paranoid disorder (HCC)   . Schizophrenia (HCC)   . Seizure disorder [facility])     Current Outpatient Medications on File Prior to Visit  Medication Sig Dispense Refill  . benztropine (COGENTIN) 1 MG tablet Take 2 mg by mouth at bedtime.     . busPIRone (BUSPAR) 5 MG tablet Take 5 mg by mouth 3 (three) times daily.    . busPIRone (BUSPAR) 7.5 MG tablet Take 7.5 mg by mouth 3 (three) times daily.    . cloZAPine (CLOZARIL) 200 MG tablet Take 1 tablet (200 mg total) by mouth 2 (two) times daily. Patient to take 200 mg in morning and 200 mg at bed time 60 tablet 0  . divalproex (DEPAKOTE ER) 500 MG 24 hr tablet Take 1,000 mg by mouth at bedtime.     . docusate sodium (COLACE) 100 MG capsule Take 100 mg by mouth daily as needed for mild constipation.    . hydrOXYzine (ATARAX/VISTARIL) 25 MG tablet Take 25 mg by mouth 4 (four) times daily as needed for anxiety.    Marland Kitchen loratadine (CLARITIN) 10 MG tablet Take 10 mg by mouth daily as needed for allergies.    Marland Kitchen omeprazole (PRILOSEC) 20 MG capsule Take 2 capsules (40 mg total) by mouth daily. (Patient not taking: Reported on 02/08/2019) 30 capsule 0  . tamsulosin (FLOMAX) 0.4 MG CAPS capsule Take 1 capsule (0.4 mg total) by mouth daily. 30 capsule 2  . traZODone (DESYREL) 50 MG tablet      No current facility-administered medications on file prior to visit.    No family history on file.  Social History   Socioeconomic History  . Marital status: Single    Spouse name: Not on file  . Number of children: Not on file  . Years of education: Not on file  . Highest education  level: Not on file  Occupational History  . Not on file  Tobacco Use  . Smoking status: Never Smoker  . Smokeless tobacco: Not on file  Substance and Sexual Activity  . Alcohol use: No  . Drug use: Not on file  . Sexual activity: Not on file  Other Topics Concern  . Not on file  Social History Narrative  . Not on file   Social Determinants of Health   Financial Resource Strain: Not on file  Food Insecurity: Not on file  Transportation Needs: Not on file  Physical Activity: Not on file  Stress: Not on file  Social Connections: Not on file  Intimate Partner Violence: Not on file    Review of Systems: ROS negative except for what is noted on the assessment and plan.  There were no vitals filed for this visit.  Physical Exam: Gen: A&O x3 and in no apparent distress, well appearing and nourished. HEENT: Head - normocephalic, atraumatic. Eye -  visual acuity grossly intact, conjunctiva clear, sclera non-icteric, EOM intact. Mouth - No obvious caries or periodontal disease. Neck: no obvious masses or nodules, AROM intact. CV: RRR, no murmurs, rubs, or gallops. S1/S2 presents  Resp: Clear to ascultation  bilaterally  Abd: BS (+) x4, soft, non-tender, without obvious hepatosplenomegaly or masses MSK: Grossly normal AROM and strength x4 extremities. Skin: good skin turgor, no rashes, unusual bruising, or prominent lesions.  Neuro: No focal deficits, grossly normal sensation and coordination.  Psych: Oriented x3 and responding appropriately. Intact recent and remote memory, normal mood, judgement, affect , and insight.    Assessment & Plan:   See Encounters Tab for problem based charting.  Patient discussed with Dr. Janeece Agee, D.O. [facility] Medicine, PGY-2 Pager: [phone], Phone: [phone] Date 05/14/2020 Time 2:59 PM

## 2020-05-18 ENCOUNTER — Encounter: Payer: Self-pay | Admitting: Internal Medicine

## 2020-05-18 DIAGNOSIS — K5900 Constipation, unspecified: Secondary | ICD-10-CM | POA: Insufficient documentation

## 2020-05-18 DIAGNOSIS — R69 Illness, unspecified: Secondary | ICD-10-CM | POA: Diagnosis not present

## 2020-05-18 DIAGNOSIS — Z79899 Other long term (current) drug therapy: Secondary | ICD-10-CM | POA: Diagnosis not present

## 2020-05-18 NOTE — Assessment & Plan Note (Signed)
Continues to have symptoms. He has not started Flomax yet. Will follow up with patient once he has started this medication.

## 2020-05-18 NOTE — Progress Notes (Signed)
Internal Medicine Clinic Attending  Case discussed with Dr. Coe  At the time of the visit.  We reviewed the resident's history and exam and pertinent patient test results.  I agree with the assessment, diagnosis, and plan of care documented in the resident's note.  

## 2020-05-18 NOTE — Assessment & Plan Note (Signed)
Patient presents with a history of constipation. He states that he has hard pebble stools weekly. He can got up to 2 days without having a BM. No blood in stool and no pain with defecation.   Plan: - Miralax daily as needed.

## 2020-05-25 DIAGNOSIS — R69 Illness, unspecified: Secondary | ICD-10-CM | POA: Diagnosis not present

## 2020-05-25 DIAGNOSIS — Z79899 Other long term (current) drug therapy: Secondary | ICD-10-CM | POA: Diagnosis not present

## 2020-06-01 DIAGNOSIS — Z79899 Other long term (current) drug therapy: Secondary | ICD-10-CM | POA: Diagnosis not present

## 2020-06-01 DIAGNOSIS — R69 Illness, unspecified: Secondary | ICD-10-CM | POA: Diagnosis not present

## 2020-06-09 DIAGNOSIS — Z79899 Other long term (current) drug therapy: Secondary | ICD-10-CM | POA: Diagnosis not present

## 2020-06-09 DIAGNOSIS — R69 Illness, unspecified: Secondary | ICD-10-CM | POA: Diagnosis not present

## 2020-06-15 DIAGNOSIS — R69 Illness, unspecified: Secondary | ICD-10-CM | POA: Diagnosis not present

## 2020-06-15 DIAGNOSIS — Z79899 Other long term (current) drug therapy: Secondary | ICD-10-CM | POA: Diagnosis not present

## 2020-06-24 DIAGNOSIS — R69 Illness, unspecified: Secondary | ICD-10-CM | POA: Diagnosis not present

## 2020-06-24 DIAGNOSIS — Z79899 Other long term (current) drug therapy: Secondary | ICD-10-CM | POA: Diagnosis not present

## 2020-06-29 DIAGNOSIS — Z79899 Other long term (current) drug therapy: Secondary | ICD-10-CM | POA: Diagnosis not present

## 2020-06-29 DIAGNOSIS — R69 Illness, unspecified: Secondary | ICD-10-CM | POA: Diagnosis not present

## 2020-07-07 ENCOUNTER — Other Ambulatory Visit: Payer: Self-pay | Admitting: Internal Medicine

## 2020-07-07 DIAGNOSIS — K5900 Constipation, unspecified: Secondary | ICD-10-CM

## 2020-07-07 DIAGNOSIS — Z79899 Other long term (current) drug therapy: Secondary | ICD-10-CM | POA: Diagnosis not present

## 2020-07-07 DIAGNOSIS — R69 Illness, unspecified: Secondary | ICD-10-CM | POA: Diagnosis not present

## 2020-07-13 DIAGNOSIS — Z79899 Other long term (current) drug therapy: Secondary | ICD-10-CM | POA: Diagnosis not present

## 2020-07-13 DIAGNOSIS — R69 Illness, unspecified: Secondary | ICD-10-CM | POA: Diagnosis not present

## 2020-07-20 DIAGNOSIS — Z79899 Other long term (current) drug therapy: Secondary | ICD-10-CM | POA: Diagnosis not present

## 2020-07-20 DIAGNOSIS — R69 Illness, unspecified: Secondary | ICD-10-CM | POA: Diagnosis not present

## 2020-07-28 ENCOUNTER — Encounter: Payer: Self-pay | Admitting: *Deleted

## 2020-07-28 DIAGNOSIS — R69 Illness, unspecified: Secondary | ICD-10-CM | POA: Diagnosis not present

## 2020-07-28 DIAGNOSIS — Z79899 Other long term (current) drug therapy: Secondary | ICD-10-CM | POA: Diagnosis not present

## 2020-08-03 DIAGNOSIS — Z79899 Other long term (current) drug therapy: Secondary | ICD-10-CM | POA: Diagnosis not present

## 2020-08-03 DIAGNOSIS — R69 Illness, unspecified: Secondary | ICD-10-CM | POA: Diagnosis not present

## 2020-08-06 ENCOUNTER — Other Ambulatory Visit: Payer: Self-pay | Admitting: Internal Medicine

## 2020-08-10 DIAGNOSIS — Z79899 Other long term (current) drug therapy: Secondary | ICD-10-CM | POA: Diagnosis not present

## 2020-08-10 DIAGNOSIS — R69 Illness, unspecified: Secondary | ICD-10-CM | POA: Diagnosis not present

## 2020-08-13 DIAGNOSIS — R69 Illness, unspecified: Secondary | ICD-10-CM | POA: Diagnosis not present

## 2020-08-17 DIAGNOSIS — R69 Illness, unspecified: Secondary | ICD-10-CM | POA: Diagnosis not present

## 2020-08-17 DIAGNOSIS — Z79899 Other long term (current) drug therapy: Secondary | ICD-10-CM | POA: Diagnosis not present

## 2020-08-24 DIAGNOSIS — Z79899 Other long term (current) drug therapy: Secondary | ICD-10-CM | POA: Diagnosis not present

## 2020-08-24 DIAGNOSIS — R69 Illness, unspecified: Secondary | ICD-10-CM | POA: Diagnosis not present

## 2020-09-03 ENCOUNTER — Other Ambulatory Visit: Payer: Self-pay | Admitting: Internal Medicine

## 2020-09-03 DIAGNOSIS — R69 Illness, unspecified: Secondary | ICD-10-CM | POA: Diagnosis not present

## 2020-09-03 DIAGNOSIS — Z79899 Other long term (current) drug therapy: Secondary | ICD-10-CM | POA: Diagnosis not present

## 2020-09-03 NOTE — Telephone Encounter (Signed)
tamsulosin (FLOMAX) 0.4 MG CAPS capsule, refill request @ [id number] - [place], [place] - [place] [address]

## 2020-09-08 DIAGNOSIS — R69 Illness, unspecified: Secondary | ICD-10-CM | POA: Diagnosis not present

## 2020-09-08 DIAGNOSIS — Z79899 Other long term (current) drug therapy: Secondary | ICD-10-CM | POA: Diagnosis not present

## 2020-09-15 DIAGNOSIS — Z79899 Other long term (current) drug therapy: Secondary | ICD-10-CM | POA: Diagnosis not present

## 2020-09-15 DIAGNOSIS — R69 Illness, unspecified: Secondary | ICD-10-CM | POA: Diagnosis not present

## 2020-09-21 DIAGNOSIS — Z79899 Other long term (current) drug therapy: Secondary | ICD-10-CM | POA: Diagnosis not present

## 2020-09-21 DIAGNOSIS — R69 Illness, unspecified: Secondary | ICD-10-CM | POA: Diagnosis not present

## 2020-09-22 ENCOUNTER — Telehealth: Payer: Self-pay | Admitting: *Deleted

## 2020-09-22 DIAGNOSIS — N401 Enlarged prostate with lower urinary tract symptoms: Secondary | ICD-10-CM

## 2020-09-22 DIAGNOSIS — N3943 Post-void dribbling: Secondary | ICD-10-CM

## 2020-09-22 DIAGNOSIS — J309 Allergic rhinitis, unspecified: Secondary | ICD-10-CM

## 2020-09-22 MED ORDER — TAMSULOSIN HCL 0.4 MG PO CAPS: 0.4000 mg | ORAL_CAPSULE | Freq: Every day | ORAL | 11 refills | Status: DC

## 2020-09-22 MED ORDER — LORATADINE 10 MG PO TABS: 10.0000 mg | ORAL_TABLET | Freq: Every day | ORAL | 11 refills | Status: DC | PRN

## 2020-09-22 NOTE — Telephone Encounter (Signed)
Patient's medications have been submitted with a years worth of refills.

## 2020-09-22 NOTE — Telephone Encounter (Signed)
Patient mother called in requesting 3 month refills on flomax and claritin to [facility]. She is aware that patient is due for 6 month f/u ~ 11/13/20.

## 2020-09-25 ENCOUNTER — Other Ambulatory Visit: Payer: Self-pay | Admitting: Internal Medicine

## 2020-09-25 DIAGNOSIS — K5900 Constipation, unspecified: Secondary | ICD-10-CM

## 2020-09-29 DIAGNOSIS — R69 Illness, unspecified: Secondary | ICD-10-CM | POA: Diagnosis not present

## 2020-09-29 DIAGNOSIS — F209 Schizophrenia, unspecified: Secondary | ICD-10-CM | POA: Diagnosis not present

## 2020-09-29 DIAGNOSIS — Z79899 Other long term (current) drug therapy: Secondary | ICD-10-CM | POA: Diagnosis not present

## 2020-10-06 DIAGNOSIS — F209 Schizophrenia, unspecified: Secondary | ICD-10-CM | POA: Diagnosis not present

## 2020-10-06 DIAGNOSIS — Z79899 Other long term (current) drug therapy: Secondary | ICD-10-CM | POA: Diagnosis not present

## 2020-10-06 DIAGNOSIS — R69 Illness, unspecified: Secondary | ICD-10-CM | POA: Diagnosis not present

## 2020-10-12 DIAGNOSIS — R69 Illness, unspecified: Secondary | ICD-10-CM | POA: Diagnosis not present

## 2020-10-13 DIAGNOSIS — R69 Illness, unspecified: Secondary | ICD-10-CM | POA: Diagnosis not present

## 2020-10-13 DIAGNOSIS — Z79899 Other long term (current) drug therapy: Secondary | ICD-10-CM | POA: Diagnosis not present

## 2020-10-13 DIAGNOSIS — F209 Schizophrenia, unspecified: Secondary | ICD-10-CM | POA: Diagnosis not present

## 2020-10-20 DIAGNOSIS — R69 Illness, unspecified: Secondary | ICD-10-CM | POA: Diagnosis not present

## 2020-10-20 DIAGNOSIS — Z79899 Other long term (current) drug therapy: Secondary | ICD-10-CM | POA: Diagnosis not present

## 2020-10-20 DIAGNOSIS — F209 Schizophrenia, unspecified: Secondary | ICD-10-CM | POA: Diagnosis not present

## 2020-10-27 DIAGNOSIS — R69 Illness, unspecified: Secondary | ICD-10-CM | POA: Diagnosis not present

## 2020-10-27 DIAGNOSIS — Z79899 Other long term (current) drug therapy: Secondary | ICD-10-CM | POA: Diagnosis not present

## 2020-10-27 DIAGNOSIS — F209 Schizophrenia, unspecified: Secondary | ICD-10-CM | POA: Diagnosis not present

## 2020-11-03 DIAGNOSIS — F209 Schizophrenia, unspecified: Secondary | ICD-10-CM | POA: Diagnosis not present

## 2020-11-03 DIAGNOSIS — Z79899 Other long term (current) drug therapy: Secondary | ICD-10-CM | POA: Diagnosis not present

## 2020-11-03 DIAGNOSIS — R69 Illness, unspecified: Secondary | ICD-10-CM | POA: Diagnosis not present

## 2020-11-10 DIAGNOSIS — Z79899 Other long term (current) drug therapy: Secondary | ICD-10-CM | POA: Diagnosis not present

## 2020-11-10 DIAGNOSIS — R69 Illness, unspecified: Secondary | ICD-10-CM | POA: Diagnosis not present

## 2020-11-10 DIAGNOSIS — F209 Schizophrenia, unspecified: Secondary | ICD-10-CM | POA: Diagnosis not present

## 2020-11-16 DIAGNOSIS — R69 Illness, unspecified: Secondary | ICD-10-CM | POA: Diagnosis not present

## 2020-11-16 DIAGNOSIS — F209 Schizophrenia, unspecified: Secondary | ICD-10-CM | POA: Diagnosis not present

## 2020-11-16 DIAGNOSIS — Z79899 Other long term (current) drug therapy: Secondary | ICD-10-CM | POA: Diagnosis not present

## 2020-11-19 ENCOUNTER — Ambulatory Visit (INDEPENDENT_AMBULATORY_CARE_PROVIDER_SITE_OTHER): Payer: Medicare HMO | Admitting: *Deleted

## 2020-11-19 DIAGNOSIS — Z23 Encounter for immunization: Secondary | ICD-10-CM

## 2020-11-23 DIAGNOSIS — R69 Illness, unspecified: Secondary | ICD-10-CM | POA: Diagnosis not present

## 2020-11-23 DIAGNOSIS — Z79899 Other long term (current) drug therapy: Secondary | ICD-10-CM | POA: Diagnosis not present

## 2020-11-23 DIAGNOSIS — F209 Schizophrenia, unspecified: Secondary | ICD-10-CM | POA: Diagnosis not present

## 2020-11-25 ENCOUNTER — Other Ambulatory Visit: Payer: Self-pay | Admitting: Internal Medicine

## 2020-11-25 DIAGNOSIS — K5900 Constipation, unspecified: Secondary | ICD-10-CM

## 2020-11-30 DIAGNOSIS — R69 Illness, unspecified: Secondary | ICD-10-CM | POA: Diagnosis not present

## 2020-11-30 DIAGNOSIS — F209 Schizophrenia, unspecified: Secondary | ICD-10-CM | POA: Diagnosis not present

## 2020-11-30 DIAGNOSIS — Z79899 Other long term (current) drug therapy: Secondary | ICD-10-CM | POA: Diagnosis not present

## 2020-12-09 DIAGNOSIS — Z79899 Other long term (current) drug therapy: Secondary | ICD-10-CM | POA: Diagnosis not present

## 2020-12-09 DIAGNOSIS — F209 Schizophrenia, unspecified: Secondary | ICD-10-CM | POA: Diagnosis not present

## 2020-12-09 DIAGNOSIS — R69 Illness, unspecified: Secondary | ICD-10-CM | POA: Diagnosis not present

## 2020-12-14 DIAGNOSIS — R69 Illness, unspecified: Secondary | ICD-10-CM | POA: Diagnosis not present

## 2020-12-14 DIAGNOSIS — F209 Schizophrenia, unspecified: Secondary | ICD-10-CM | POA: Diagnosis not present

## 2020-12-22 DIAGNOSIS — F209 Schizophrenia, unspecified: Secondary | ICD-10-CM | POA: Diagnosis not present

## 2020-12-22 DIAGNOSIS — R69 Illness, unspecified: Secondary | ICD-10-CM | POA: Diagnosis not present

## 2020-12-28 DIAGNOSIS — F209 Schizophrenia, unspecified: Secondary | ICD-10-CM | POA: Diagnosis not present

## 2020-12-28 DIAGNOSIS — R69 Illness, unspecified: Secondary | ICD-10-CM | POA: Diagnosis not present

## 2020-12-31 DIAGNOSIS — R69 Illness, unspecified: Secondary | ICD-10-CM | POA: Diagnosis not present

## 2021-01-04 DIAGNOSIS — F209 Schizophrenia, unspecified: Secondary | ICD-10-CM | POA: Diagnosis not present

## 2021-01-04 DIAGNOSIS — R69 Illness, unspecified: Secondary | ICD-10-CM | POA: Diagnosis not present

## 2021-01-11 ENCOUNTER — Other Ambulatory Visit: Payer: Self-pay | Admitting: Internal Medicine

## 2021-01-11 DIAGNOSIS — F209 Schizophrenia, unspecified: Secondary | ICD-10-CM | POA: Diagnosis not present

## 2021-01-11 DIAGNOSIS — K5900 Constipation, unspecified: Secondary | ICD-10-CM

## 2021-01-11 DIAGNOSIS — R69 Illness, unspecified: Secondary | ICD-10-CM | POA: Diagnosis not present

## 2021-01-19 DIAGNOSIS — R69 Illness, unspecified: Secondary | ICD-10-CM | POA: Diagnosis not present

## 2021-01-19 DIAGNOSIS — F209 Schizophrenia, unspecified: Secondary | ICD-10-CM | POA: Diagnosis not present

## 2021-02-02 DIAGNOSIS — U071 COVID-19: Secondary | ICD-10-CM | POA: Diagnosis not present

## 2021-02-02 DIAGNOSIS — Z20822 Contact with and (suspected) exposure to covid-19: Secondary | ICD-10-CM | POA: Diagnosis not present

## 2021-03-04 ENCOUNTER — Encounter: Payer: Medicare HMO | Admitting: Internal Medicine

## 2021-03-04 ENCOUNTER — Telehealth: Payer: Self-pay

## 2021-03-04 NOTE — Telephone Encounter (Signed)
I Left voice mail message about missed appointment [place], [place] [id number]:37 PM

## 2021-04-01 IMAGING — US US RENAL
1 series · 14 of 25 positions shown · non-contrast
Comparison: None.

CLINICAL DATA: Urinary retention

EXAM:
RENAL / URINARY TRACT ULTRASOUND COMPLETE

[Series 1: us renal · 14 of 25 slices shown]
[im 1/25]
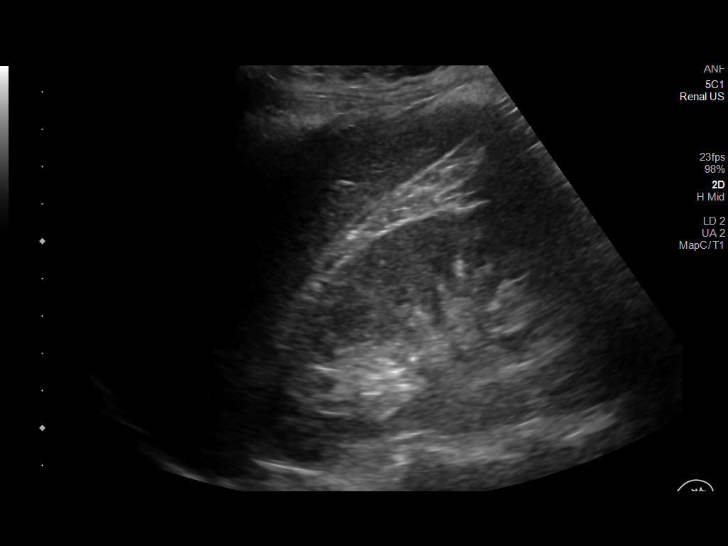
[im 3/25]
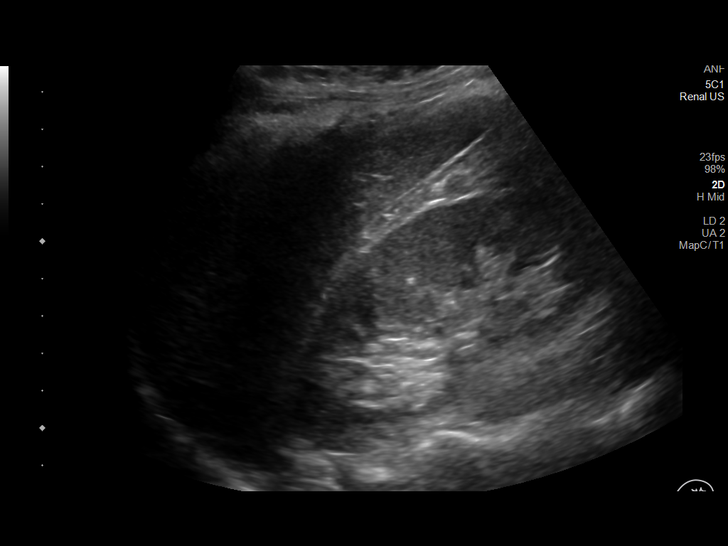
[im 5/25]
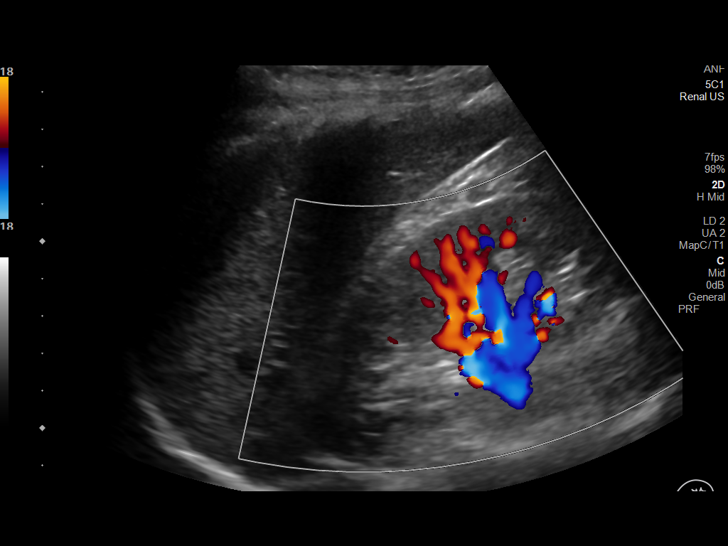
[im 7/25]
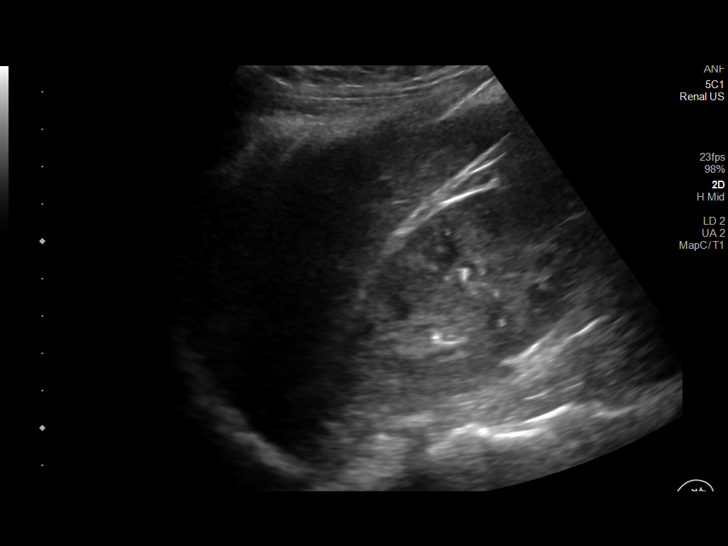
[im 9/25]
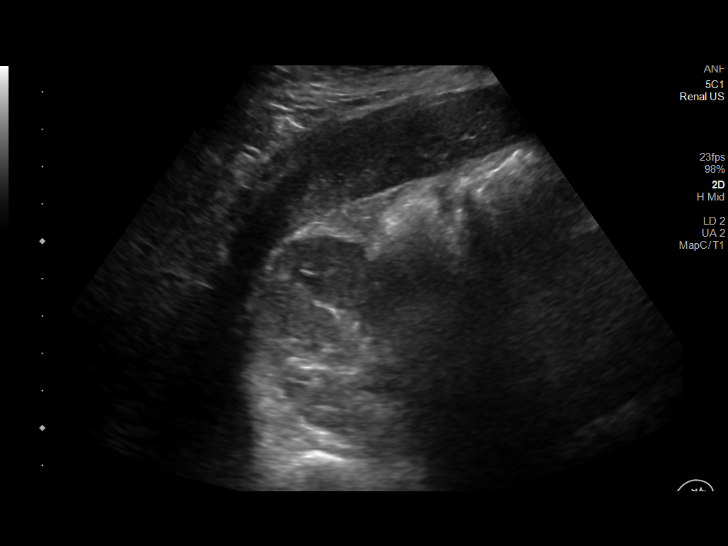
[im 10/25]
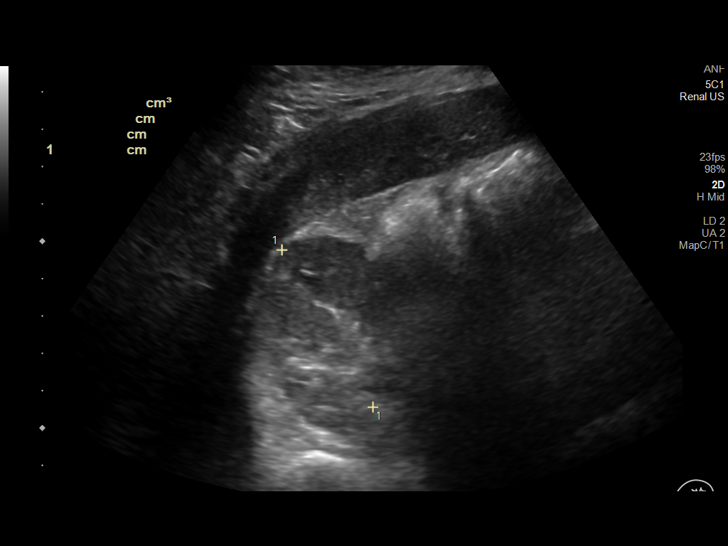
[im 12/25]
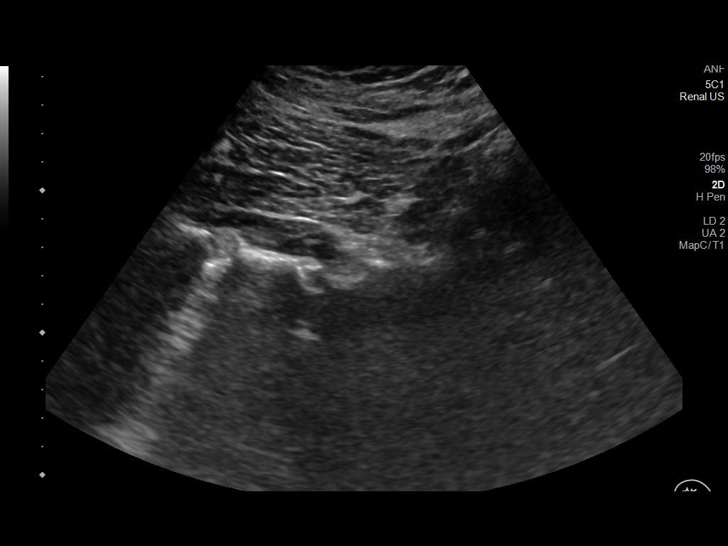
[im 14/25]
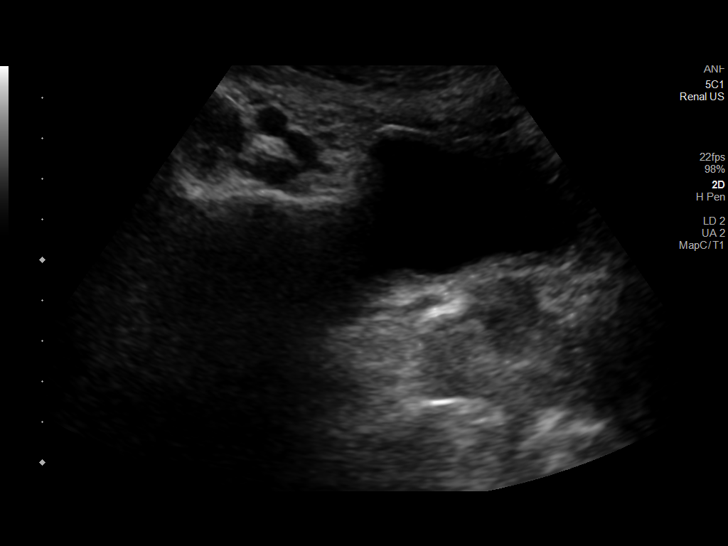
[im 16/25]
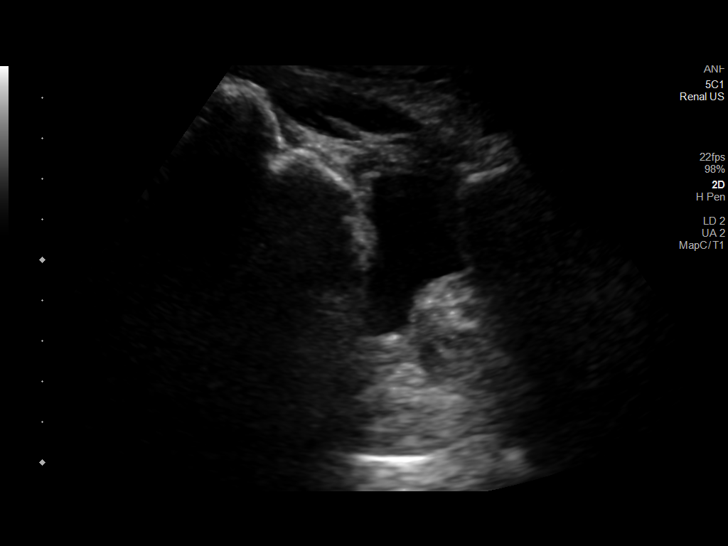
[im 17/25]
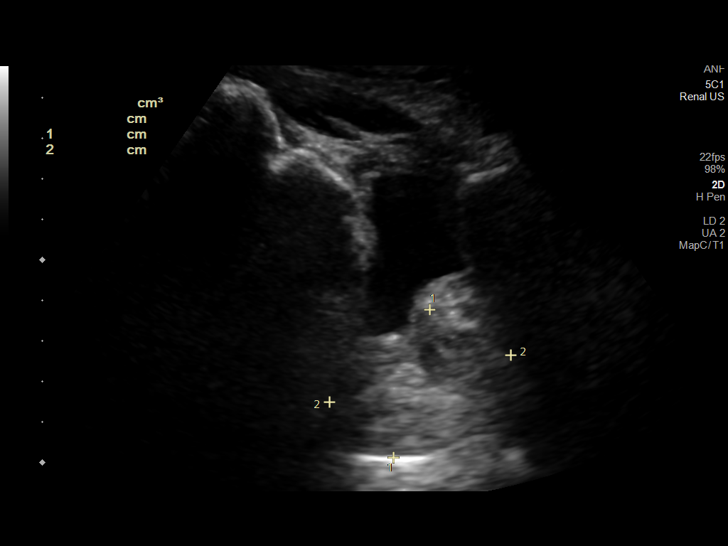
[im 19/25]
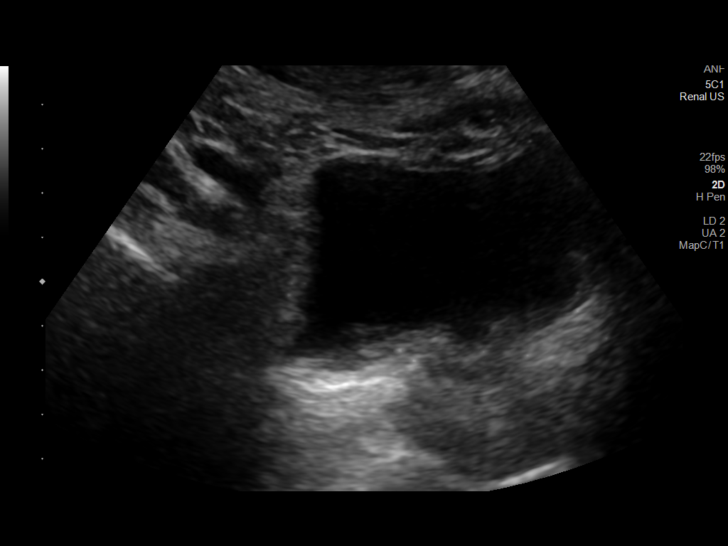
[im 21/25]
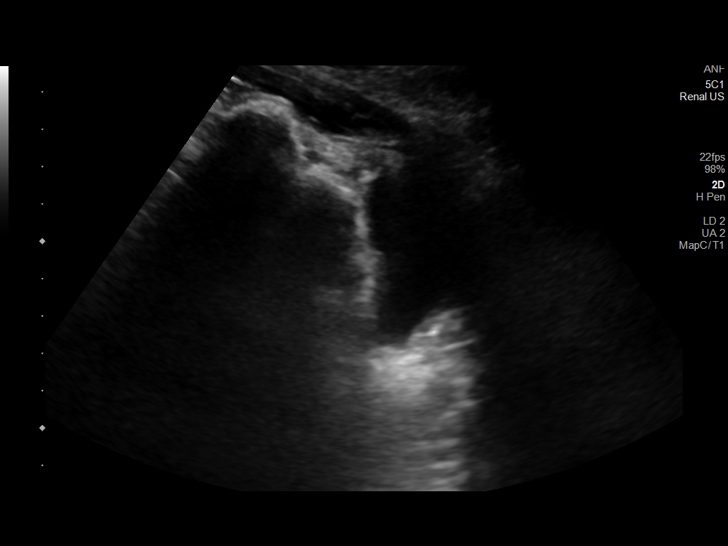
[im 23/25]
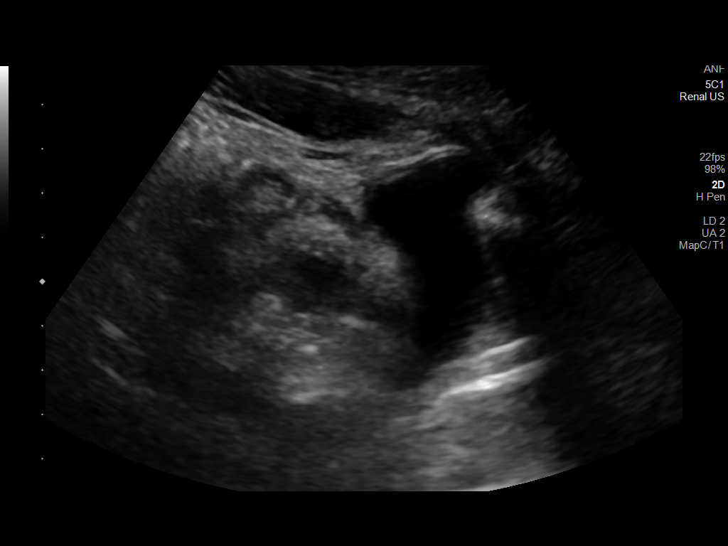
[im 25/25]
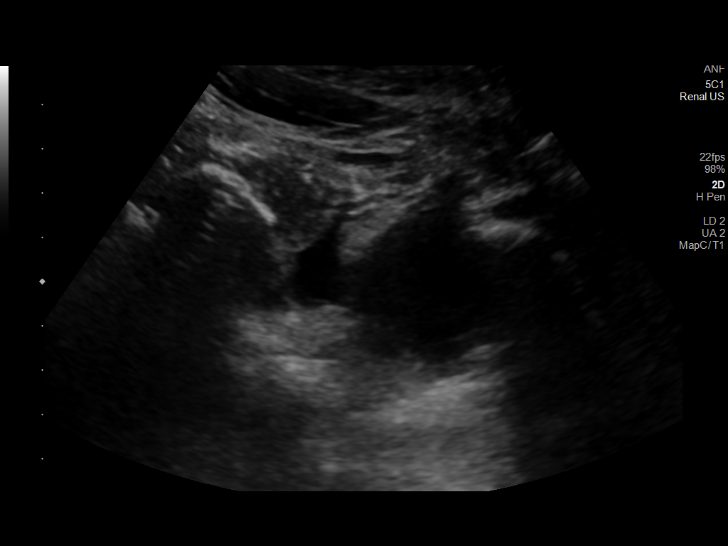

[14 of 25 positions shown; findings below may reference images not displayed]

FINDINGS: Right Kidney:

Renal measurements: 10.0 x 4.6 x 4.9 cm = volume: 116.5 mL.
Echogenicity within normal limits. No mass or hydronephrosis
visualized.

Left Kidney:

Not discretely visualized.

Bladder:

Appears normal for degree of bladder distention.

Other:

Mild prostatomegaly, indenting the base of the bladder, measuring
3.8 x 4.6 x 4.8 cm.
IMPRESSION: Right kidney is unremarkable. No hydronephrosis.

Left kidney is not discretely visualized.

Mild prostatomegaly.

## 2021-04-05 ENCOUNTER — Other Ambulatory Visit: Payer: Self-pay

## 2021-04-05 DIAGNOSIS — K5900 Constipation, unspecified: Secondary | ICD-10-CM

## 2021-04-05 MED ORDER — PEG 3350 17 G PO PACK: PACK | ORAL | 0 refills | Status: DC

## 2021-05-03 ENCOUNTER — Other Ambulatory Visit: Payer: Self-pay | Admitting: Internal Medicine

## 2021-05-03 DIAGNOSIS — K5900 Constipation, unspecified: Secondary | ICD-10-CM

## 2021-05-05 ENCOUNTER — Encounter: Payer: Self-pay | Admitting: *Deleted

## 2021-05-05 NOTE — Telephone Encounter (Signed)
Last OV was 05/14/20. My chart sent to pt to schedule an appt. ?

## 2021-06-03 ENCOUNTER — Other Ambulatory Visit: Payer: Self-pay | Admitting: Internal Medicine

## 2021-06-03 DIAGNOSIS — K5900 Constipation, unspecified: Secondary | ICD-10-CM

## 2021-06-03 NOTE — Telephone Encounter (Signed)
Called pt to schedule an appt; last appt was 04/2020. Talked to his mother who will be the driver. Appt schedule Thursday 5/18 @ 1015Am with Dr Sharene Butters. ?

## 2021-06-10 ENCOUNTER — Encounter: Payer: Medicare Other | Admitting: Internal Medicine

## 2021-06-11 ENCOUNTER — Encounter: Payer: Self-pay | Admitting: Internal Medicine

## 2021-07-17 ENCOUNTER — Encounter: Payer: Self-pay | Admitting: *Deleted

## 2021-08-09 NOTE — Progress Notes (Deleted)
BPH -tamsulosin 0.4mg   Schizophrenia- Managed by Dr. Teena Irani at [facility]. Living at home with mother. -clozapine 200mg  BID -benztropine 1mg  BID -hydroxyzine 25 mg 4 times daily -trazadone 50 mg daily  Pre-diabetes- Last A1c 6.25 Jan 2020. Referral placed to donna at that time.  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

## 2021-09-03 ENCOUNTER — Other Ambulatory Visit: Payer: Self-pay | Admitting: Internal Medicine

## 2021-09-03 DIAGNOSIS — N3943 Post-void dribbling: Secondary | ICD-10-CM

## 2021-10-04 ENCOUNTER — Other Ambulatory Visit: Payer: Self-pay | Admitting: Internal Medicine

## 2021-10-04 DIAGNOSIS — J309 Allergic rhinitis, unspecified: Secondary | ICD-10-CM

## 2021-10-05 NOTE — Telephone Encounter (Signed)
Next appt scheduled 10/23 with PCP. 

## 2021-10-14 ENCOUNTER — Other Ambulatory Visit: Payer: Self-pay | Admitting: Internal Medicine

## 2021-10-14 DIAGNOSIS — K5900 Constipation, unspecified: Secondary | ICD-10-CM

## 2021-11-05 ENCOUNTER — Encounter (HOSPITAL_COMMUNITY): Payer: Self-pay | Admitting: *Deleted

## 2021-11-05 ENCOUNTER — Ambulatory Visit (HOSPITAL_COMMUNITY)
Admission: EM | Admit: 2021-11-05 | Discharge: 2021-11-05 | Disposition: A | Discharge status: Home / Self Care | Payer: Medicare Other | Attending: Internal Medicine | Admitting: Internal Medicine

## 2021-11-05 DIAGNOSIS — S39012A Strain of muscle, fascia and tendon of lower back, initial encounter: Secondary | ICD-10-CM | POA: Diagnosis not present

## 2021-11-05 DIAGNOSIS — R350 Frequency of micturition: Secondary | ICD-10-CM

## 2021-11-05 LAB — POCT URINALYSIS DIPSTICK, ED / UC
Bilirubin Urine: NEGATIVE
Glucose, UA: NEGATIVE mg/dL
Hgb urine dipstick: NEGATIVE
Leukocytes,Ua: NEGATIVE
Nitrite: NEGATIVE
Protein, ur: NEGATIVE mg/dL
Specific Gravity, Urine: 1.025 (ref 1.005–1.030)
Urobilinogen, UA: 1 mg/dL (ref 0.0–1.0)
pH: 6 (ref 5.0–8.0)

## 2021-11-05 MED ORDER — METHOCARBAMOL 500 MG PO TABS: 500.0000 mg | ORAL_TABLET | Freq: Every evening | ORAL | 0 refills | Status: AC | PRN

## 2021-11-05 MED ORDER — IBUPROFEN 600 MG PO TABS: 600.0000 mg | ORAL_TABLET | Freq: Four times a day (QID) | ORAL | 0 refills | Status: AC | PRN

## 2021-11-05 NOTE — ED Triage Notes (Signed)
Pt states he got up quickly from bed last night and his lower back has been hurting since. Mom states she has been giving his IBU he says it helps but mom wanted to make sure it wasn't anything else needed.   Mom is worried about his weight and would like to get a finger stick she is worried he might have DM.

## 2021-11-05 NOTE — ED Provider Notes (Signed)
[facility]    CSN: [id number] Arrival date & time: 11/05/21  1550      History   Chief Complaint Chief Complaint  Patient presents with   Back Pain    HPI Blake Long. is a 46 y.o. male.   Patient presents urgent care with his mother for evaluation of right-sided lumbar back pain that started this morning after he "got out of bed the wrong way and may have pulled a muscle".  Mom placed muscle rub to the back and gave him 400 mg of ibuprofen this morning which helped significantly with his symptoms.  He is currently experiencing 6 on a scale of 0-10 back pain to the lumbar spine on the right side.  Movement makes the pain worse.  No saddle anesthesia symptoms, urine/stool incontinence, numbness or tingling to the bilateral lower extremities, or radiating pain.  No nausea, vomiting, dizziness, urinary symptoms, penile discharge, or abdominal pain.  No recent falls, trauma, or injuries to the back.  Mom is also concerned that patient may have diabetes and would like for him to be tested for this today.  She states that he has been urinating more frequently than normal but denies polydipsia.  Last PCP visit was 6 months ago and A1c was 6.1 at that time.  He was labeled as prediabetic at that time but does not currently take any medications to lower his blood sugar.   Back Pain   Past Medical History:  Diagnosis Date   Anxiety    Cognitive impairment    GERD (gastroesophageal reflux disease)    Hyperglycemia    Hypertension    Overactive bladder    Paranoid disorder ([place])    Schizophrenia ([place])    Seizure disorder [facility])     Patient Active Problem List   Diagnosis Date Noted   Constipation 05/18/2020   BPH (benign prostatic hyperplasia) 02/24/2020   Prediabetes 02/24/2020   Healthcare maintenance 02/24/2020   Convulsion disorder ([place]) 02/09/2019   Cognitive impairment    Schizophrenia ([place]) 02/03/2019    History reviewed. No pertinent surgical  history.     Home Medications    Prior to Admission medications   Medication Sig Start Date End Date Taking? Authorizing Provider  benztropine (COGENTIN) 1 MG tablet Take 2 mg by mouth at bedtime.    Yes [provider]  busPIRone (BUSPAR) 7.5 MG tablet Take 7.5 mg by mouth 3 (three) times daily. 11/07/19  Yes [provider]  cloZAPine (CLOZARIL) 200 MG tablet Take 1 tablet (200 mg total) by mouth 2 (two) times daily. Patient to take 200 mg in morning and 200 mg at bed time 02/11/19  Yes Rankin, Shuvon B, NP  divalproex (DEPAKOTE ER) 500 MG 24 hr tablet Take 1,000 mg by mouth at bedtime.    Yes [provider]  hydrOXYzine (ATARAX/VISTARIL) 25 MG tablet Take 25 mg by mouth 4 (four) times daily as needed for anxiety.   Yes [provider]  ibuprofen (ADVIL) 600 MG tablet Take 1 tablet (600 mg total) by mouth every 6 (six) hours as needed. 11/05/21  Yes Avery Eustice, Stasia Cavalier, FNP  loratadine (CLARITIN) 10 MG tablet TAKE 1 TABLET BY MOUTH DAILY AS NEEDED FOR ALLERGIES 10/07/21  Yes Masters, Katie, DO  methocarbamol (ROBAXIN) 500 MG tablet Take 1 tablet (500 mg total) by mouth at bedtime as needed for muscle spasms. 11/05/21  Yes Talbot Grumbling, FNP  Polyethylene Glycol 3350 (PEG 3350) 17 g PACK MIX AND DRINK  1 PACKET BY MOUTH DAILY WITH WATER OR JUICE as needed 10/15/21  Yes Masters, Katie, DO  tamsulosin (FLOMAX) 0.4 MG CAPS capsule TAKE 1 CAPSULE BY MOUTH DAILY 09/06/21  Yes Lacinda Axon, MD  traZODone (DESYREL) 50 MG tablet  02/19/20  Yes [provider]  busPIRone (BUSPAR) 5 MG tablet Take 5 mg by mouth 3 (three) times daily.    [provider]  docusate sodium (COLACE) 100 MG capsule Take 100 mg by mouth daily as needed for mild constipation.    [provider]  omeprazole (PRILOSEC) 20 MG capsule Take 2 capsules (40 mg total) by mouth daily. Patient not taking: Reported on 02/08/2019 02/04/19   Rankin, Mercy Moore, NP     Family History Family History  Problem Relation Age of Onset   Cancer Mother     Social History Social History   Tobacco Use   Smoking status: Never  Vaping Use   Vaping Use: Never used  Substance Use Topics   Alcohol use: No   Drug use: Never     Allergies   Patient has no known allergies.   Review of Systems Review of Systems  Musculoskeletal:  Positive for back pain.  Per HPI   Physical Exam Triage Vital Signs ED Triage Vitals  Enc Vitals Group     BP 11/05/21 1649 119/82     Pulse Rate 11/05/21 1649 (!) 107     Resp 11/05/21 1649 18     Temp 11/05/21 1649 98 F (36.7 C)     Temp Source 11/05/21 1649 Oral     SpO2 11/05/21 1649 98 %     Weight --      Height --      Head Circumference --      Peak Flow --      Pain Score 11/05/21 1643 6     Pain Loc --      Pain Edu? --      Excl. in [place]? --    No data found.  Updated Vital Signs BP 119/82 (BP Location: Right Arm)   Pulse (!) 107   Temp 98 F (36.7 C) (Oral)   Resp 18   SpO2 98%   Visual Acuity Right Eye Distance:   Left Eye Distance:   Bilateral Distance:    Right Eye Near:   Left Eye Near:    Bilateral Near:     Physical Exam Vitals and nursing note reviewed.  Constitutional:      Appearance: He is not ill-appearing or toxic-appearing.  HENT:     Head: Normocephalic and atraumatic.     Right Ear: Hearing and external ear normal.     Left Ear: Hearing and external ear normal.     Nose: Nose normal.     Mouth/Throat:     Lips: Pink.     Mouth: Mucous membranes are moist.     Pharynx: No posterior oropharyngeal erythema.  Eyes:     General: Lids are normal. Vision grossly intact. Gaze aligned appropriately.        Right eye: No discharge.        Left eye: No discharge.     Extraocular Movements: Extraocular movements intact.     Conjunctiva/sclera: Conjunctivae normal.     Pupils: Pupils are equal, round, and reactive to light.  Cardiovascular:     Rate and Rhythm: Normal  rate and regular rhythm.     Heart sounds: Normal heart sounds, S1 normal and S2  normal.  Pulmonary:     Effort: Pulmonary effort is normal. No respiratory distress.     Breath sounds: Normal breath sounds and air entry.  Musculoskeletal:     Cervical back: Neck supple.     Comments: Lower back: No obvious sign of deformity, injury, or ecchymosis.  Decreased range of motion with forward bending at the hips due to tenderness at the right lumbar paraspinals. Mild TTP of the right lumbar paraspinals with palpation. Neurovascularly intact distal to pain.   Skin:    General: Skin is warm and dry.     Capillary Refill: Capillary refill takes less than 2 seconds.     Findings: No rash.  Neurological:     General: No focal deficit present.     Mental Status: He is alert and oriented to person, place, and time. Mental status is at baseline.     Cranial Nerves: Cranial nerves 2-12 are intact. No dysarthria or facial asymmetry.     Sensory: Sensation is intact.     Motor: Motor function is intact. No weakness.     Coordination: Coordination is intact.     Gait: Gait normal.     Comments: Nonfocal neuro exam.  5/5 strength to bilateral upper extremities and lower extremities against resistance.  Sensation intact bilaterally.  Psychiatric:        Mood and Affect: Mood normal.        Speech: Speech normal.        Behavior: Behavior normal.        Thought Content: Thought content normal.        Judgment: Judgment normal.      UC Treatments / Results  Labs (all labs ordered are listed, but only abnormal results are displayed) Labs Reviewed  POCT URINALYSIS DIPSTICK, ED / UC - Abnormal; Notable for the following components:      Result Value   Ketones, ur TRACE (*)    All other components within normal limits    EKG   Radiology No results found.  Procedures Procedures (including critical care time)  Medications Ordered in UC Medications - No data to display  Initial Impression /  Assessment and Plan / UC Course  I have reviewed the triage vital signs and the nursing notes.  Pertinent labs & imaging results that were available during my care of the patient were reviewed by me and considered in my medical decision making (see chart for details).   1.  Strain of lumbar region Symptoms and physical exam are consistent with acute muscle strain of the paraspinals of the lumbar spine.  We will manage this with NSAIDs, muscle relaxer, heat, and gentle range of motion exercises.  Patient may continue to take ibuprofen 600 mg every 6 hours as needed for inflammation and pain.  Robaxin muscle relaxer may be used every 12 hours as needed for muscle spasm and does not have any drug interactions with patient's psychiatric medications.  Advised patient to avoid taking this during the daytime as it can make him very sleepy.  Advised gentle range of motion exercises and heat to the back to reduce pain further and prevent muscle stiffness.  Urinalysis is unremarkable for signs of urinary tract infection or glucosuria.  There is no indication for capillary blood glucose screening today as patient does not have any glycosuria and does not appear to be dehydrated, shaky, or confused.  Advised PCP follow-up in the next 1 to 2 weeks for reevaluation and screening for diabetes.  Mom states that patient does have an appointment with PCP and plans to discuss this at that time.  No clinical indication for imaging or referral to ED at this time based on stable vital signs and musculoskeletal exam.  Patient and mom expressed agreement with this plan.  Discussed physical exam and available lab work findings in clinic with patient.  Counseled patient regarding appropriate use of medications and potential side effects for all medications recommended or prescribed today. Discussed red flag signs and symptoms of worsening condition,when to call the PCP office, return to urgent care, and when to seek higher level of  care in the emergency department. Patient verbalizes understanding and agreement with plan. All questions answered. Patient discharged in stable condition.    Final diagnoses:  Strain of lumbar region, initial encounter  Urinary frequency     Discharge Instructions      You have been evaluated in the today for your back pain. Your pain is most likely muscle strain which will improve on its own with time.   Take ibuprofen with food every 6 hours to treat pain and inflammation as needed. Do not take any other NSAID containing medicine when taking ibuprofen like naproxen, Advil, Aleve, motrin, aspirin, Voltaren, diclofenac, and other medicines. Take this medication with a snack to avoid stomach upset.   You may also take Robaxin muscle relaxer every 12 hours, but mostly take this at bedtime as needed..  Do not take this medication and drive or drink alcohol as it can make you sleepy.  Mainly use this medicine at nighttime as needed.  Apply heat and perform gentle range of motion exercises to the area of greatest pain to prevent muscle stiffness and provide further pain relief.   Follow-up with your primary care provider for further screening of diabetes.  Continue to eat a well-balanced diet and limit the amount of sugar in your diet.  Follow-up with your primary care provider or return to urgent care if your symptoms do not improve in the next 3 to 4 days with medications and interventions recommended today.  If you develop any new or worsening symptoms, please return to urgent care.  If your symptoms are severe, please go to the emergency room.  I hope you feel better!     ED Prescriptions     Medication Sig Dispense Auth. Provider   ibuprofen (ADVIL) 600 MG tablet Take 1 tablet (600 mg total) by mouth every 6 (six) hours as needed. 30 tablet Talbot Grumbling, FNP   methocarbamol (ROBAXIN) 500 MG tablet Take 1 tablet (500 mg total) by mouth at bedtime as needed for muscle spasms.  20 tablet Talbot Grumbling, FNP      PDMP not reviewed this encounter.   Talbot Grumbling,  11/05/21 [phone]

## 2021-11-05 NOTE — Discharge Instructions (Addendum)
You have been evaluated in the today for your back pain. Your pain is most likely muscle strain which will improve on its own with time.   Take ibuprofen with food every 6 hours to treat pain and inflammation as needed. Do not take any other NSAID containing medicine when taking ibuprofen like naproxen, Advil, Aleve, motrin, aspirin, Voltaren, diclofenac, and other medicines. Take this medication with a snack to avoid stomach upset.   You may also take Robaxin muscle relaxer every 12 hours, but mostly take this at bedtime as needed..  Do not take this medication and drive or drink alcohol as it can make you sleepy.  Mainly use this medicine at nighttime as needed.  Apply heat and perform gentle range of motion exercises to the area of greatest pain to prevent muscle stiffness and provide further pain relief.   Follow-up with your primary care provider for further screening of diabetes.  Continue to eat a well-balanced diet and limit the amount of sugar in your diet.  Follow-up with your primary care provider or return to urgent care if your symptoms do not improve in the next 3 to 4 days with medications and interventions recommended today.  If you develop any new or worsening symptoms, please return to urgent care.  If your symptoms are severe, please go to the emergency room.  I hope you feel better!

## 2021-11-15 ENCOUNTER — Other Ambulatory Visit: Payer: Self-pay

## 2021-11-15 ENCOUNTER — Ambulatory Visit (INDEPENDENT_AMBULATORY_CARE_PROVIDER_SITE_OTHER): Payer: Medicare Other | Admitting: Internal Medicine

## 2021-11-15 VITALS — BP 119/80 | HR 100 | Temp 98.0°F | Ht 68.0 in | Wt 128.6 lb

## 2021-11-15 DIAGNOSIS — H6121 Impacted cerumen, right ear: Secondary | ICD-10-CM | POA: Diagnosis not present

## 2021-11-15 DIAGNOSIS — Z23 Encounter for immunization: Secondary | ICD-10-CM | POA: Diagnosis not present

## 2021-11-15 DIAGNOSIS — R7303 Prediabetes: Secondary | ICD-10-CM | POA: Diagnosis not present

## 2021-11-15 DIAGNOSIS — Z0000 Encounter for general adult medical examination without abnormal findings: Secondary | ICD-10-CM

## 2021-11-15 DIAGNOSIS — S39012D Strain of muscle, fascia and tendon of lower back, subsequent encounter: Secondary | ICD-10-CM | POA: Insufficient documentation

## 2021-11-15 DIAGNOSIS — H6120 Impacted cerumen, unspecified ear: Secondary | ICD-10-CM | POA: Insufficient documentation

## 2021-11-15 LAB — POCT GLYCOSYLATED HEMOGLOBIN (HGB A1C): Hemoglobin A1C: 5.8 % — AB (ref 4.0–5.6)

## 2021-11-15 LAB — GLUCOSE, CAPILLARY: Glucose-Capillary: 121 mg/dL — ABNORMAL HIGH (ref 70–99)

## 2021-11-15 MED ORDER — DICLOFENAC SODIUM 1 % EX GEL: 2.0000 g | Freq: Four times a day (QID) | CUTANEOUS | 3 refills | Status: AC

## 2021-11-15 NOTE — Assessment & Plan Note (Signed)
Patient following up for prediabetes.  Last hemoglobin A1c at 6.1 in January 2022.  His mother states that he has changed his diet significantly and they walk 2-3 times weekly.  He does have a family history of cardiovascular disease and diabetes. A/P: Repeat A1c at 5.8.  We will plan to repeat A1c in 1 year time.

## 2021-11-15 NOTE — Assessment & Plan Note (Signed)
Patient's mother states that he sometime appears to have difficulty hearing and she has to repeat herself multiple times. On physical exam, patient has hardened cerumen that makes tympanic membrane difficult to visualize. A/P: Large piece of cerumen was removed using gentle water. Area that piece was removed from appeared irritated. I talked with patient's mom about using debrox and coming back in if continues to be bothersome.

## 2021-11-15 NOTE — Assessment & Plan Note (Addendum)
Blake Long was seen at urgent care October 13 due to back pain.  He states that morning he moved to quickly take out of bed and then had back pain.  Denies trauma or falls.  At the urgent care, he was diagnosed with lumbar strain and given ibuprofen and muscle relaxers to help with this.  He states that he continues to have some mild back pain.  Denies numbness or tingling in legs bilaterally.  He denies saddle anesthesia. A/P: I talked with patient and mom about lumbar strain taking up to 6 weeks to improve.  -continue ibuprofen PRN -voltaren gel PRN -exercises printed for patient and were reviewed

## 2021-11-15 NOTE — Patient Instructions (Signed)
Thank you, Blake Long Helaine Chess. for allowing [place] to provide your care today.  Pre-diabetes- We are rechecking HgbA1c. This is an average of your blood sugars over 3 months. I will call with results.  Earwax- You had some earwax today. Hopefully your hearing feels a bit better.  Back pain- it may take up to 6 weeks for your back strain to improve. Please also try using voltaren gel. I have included some exercises to help with strain. These exercises will help loosen up your back.  Thank you for getting flu shot done today!  You are due for colonoscopy and tetanus shot. Please consider completing these at next office visit.  I have ordered the following labs for you:   Lab Orders         Glucose, capillary         POC Hbg A1C      Referrals ordered today:   Referral Orders  No referral(s) requested today     I have ordered the following medication/changed the following medications:   Stop the following medications: Medications Discontinued During This Encounter  Medication Reason   busPIRone (BUSPAR) 5 MG tablet Change in therapy     Start the following medications: Meds ordered this encounter  Medications   diclofenac Sodium (VOLTAREN) 1 % GEL    Sig: Apply 2 g topically 4 (four) times daily.    Dispense:  2 g    Refill:  3     Follow up: 6 months   We look forward to seeing you next time. Please call our clinic at [phone] if you have any questions or concerns. The best time to call is Monday-Friday from 9am-4pm, but there is someone available 24/7. If after hours or the weekend, call the main hospital number and ask for the Internal Medicine Resident On-Call. If you need medication refills, please notify your pharmacy one week in advance and they will send [place] a request.   Thank you for trusting me with your care. Wishing you the best!   Christiana Fuchs, [place]

## 2021-11-15 NOTE — Progress Notes (Signed)
Subjective:  CC: annual visit, follow-up on pre-diabetes  HPI:  [name] is a 46 y.o. male with a past medical history stated below and presents today for follow-up on pre-diabetes. Please see problem based assessment and plan for additional details.  Past Medical History:  Diagnosis Date   Anxiety    Cognitive impairment    GERD (gastroesophageal reflux disease)    Hyperglycemia    Hypertension    Overactive bladder    Paranoid disorder ([place])    Schizophrenia ([place])    Seizure disorder ([place])     Current Outpatient Medications on File Prior to Visit  Medication Sig Dispense Refill   benztropine (COGENTIN) 1 MG tablet Take 2 mg by mouth at bedtime.      busPIRone (BUSPAR) 7.5 MG tablet Take 7.5 mg by mouth 3 (three) times daily.     cloZAPine (CLOZARIL) 200 MG tablet Take 1 tablet (200 mg total) by mouth 2 (two) times daily. Patient to take 200 mg in morning and 200 mg at bed time 60 tablet 0   divalproex (DEPAKOTE ER) 500 MG 24 hr tablet Take 1,000 mg by mouth at bedtime.      docusate sodium (COLACE) 100 MG capsule Take 100 mg by mouth daily as needed for mild constipation.     hydrOXYzine (ATARAX/VISTARIL) 25 MG tablet Take 25 mg by mouth 4 (four) times daily as needed for anxiety.     ibuprofen (ADVIL) 600 MG tablet Take 1 tablet (600 mg total) by mouth every 6 (six) hours as needed. 30 tablet 0   loratadine (CLARITIN) 10 MG tablet TAKE 1 TABLET BY MOUTH DAILY AS NEEDED FOR ALLERGIES 30 tablet 11   methocarbamol (ROBAXIN) 500 MG tablet Take 1 tablet (500 mg total) by mouth at bedtime as needed for muscle spasms. 20 tablet 0   omeprazole (PRILOSEC) 20 MG capsule Take 2 capsules (40 mg total) by mouth daily. (Patient not taking: Reported on 02/08/2019) 30 capsule 0   Polyethylene Glycol 3350 (PEG 3350) 17 g PACK MIX AND DRINK 1 PACKET BY MOUTH DAILY WITH WATER OR JUICE as needed 14 each 0   tamsulosin (FLOMAX) 0.4 MG CAPS capsule TAKE 1 CAPSULE BY MOUTH DAILY 90  capsule 3   traZODone (DESYREL) 50 MG tablet      No current facility-administered medications on file prior to visit.    Family History  Problem Relation Age of Onset   Cancer Mother     Social History   Socioeconomic History   Marital status: Single    Spouse name: Not on file   Number of children: Not on file   Years of education: Not on file   Highest education level: Not on file  Occupational History   Not on file  Tobacco Use   Smoking status: Never   Smokeless tobacco: Not on file  Vaping Use   Vaping Use: Never used  Substance and Sexual Activity   Alcohol use: No   Drug use: Never   Sexual activity: Never  Other Topics Concern   Not on file  Social History Narrative   Not on file   Social Determinants of Health   Financial Resource Strain: Not on file  Food Insecurity: Not on file  Transportation Needs: Not on file  Physical Activity: Not on file  Stress: Not on file  Social Connections: Not on file  Intimate Partner Violence: Not on file    Review of Systems: ROS negative except for what  is noted on the assessment and plan.  Objective:   Vitals:   11/15/21 1022  BP: 119/80  Pulse: 100  Temp: 98 F (36.7 C)  TempSrc: Oral  SpO2: 100%  Weight: 128 lb 9.6 oz (58.3 kg)  Height: 5\' 8"  (1.727 m)    Physical Exam: Constitutional: well-appearing, sitting with mom in chair Cardiovascular: regular rate and rhythm, no m/r/g Pulmonary/Chest: normal work of breathing on room air, lungs clear to auscultation bilaterally Abdominal: soft, non-tender, non-distended MSK: tenderness along paraspinal muscles along L3-5. No pain over vertebral spine or transverse processes Neurological: 5/5 strength in bilateral lower extremities, normal gait   Assessment & Plan:  Prediabetes Patient following up for prediabetes.  Last hemoglobin A1c at 6.1 in January 2022.  His mother states that he has changed his diet significantly and they walk 2-3 times weekly.  He  does have a family history of cardiovascular disease and diabetes. A/P: Repeat A1c at 5.8.  We will plan to repeat A1c in 1 year time.  Lumbar strain, subsequent encounter Blake Long was seen at urgent care October 13 due to back pain.  He states that morning he moved to quickly take out of bed and then had back pain.  Denies trauma or falls.  At the urgent care, he was diagnosed with lumbar strain and given ibuprofen and muscle relaxers to help with this.  He states that he continues to have some mild back pain.  Denies numbness or tingling in legs bilaterally.  He denies saddle anesthesia. A/P: I talked with patient and mom about lumbar strain taking up to 6 weeks to improve.  -continue ibuprofen PRN -voltaren gel PRN -exercises printed for patient and were reviewed  Cerumen impaction Patient's mother states that he sometime appears to have difficulty hearing and she has to repeat herself multiple times. On physical exam, patient has hardened cerumen that makes tympanic membrane difficult to visualize. A/P: Large piece of cerumen was removed using gentle water. Area that piece was removed from appeared irritated. I talked with patient's mom about using debrox and coming back in if continues to be bothersome.  Healthcare maintenance Flu shot given.    Patient discussed with Dr. Ennis Forts Sophiea Ueda, D.O. [facility]  PGY-2 Pager: [phone]  Phone: [phone] Date 11/15/2021  Time 7:44 PM

## 2021-11-15 NOTE — Assessment & Plan Note (Signed)
Flu shot given

## 2021-11-15 NOTE — Progress Notes (Deleted)
BPH -tamsulosin 0.4mg   Schizophrenia- Managed by Dr. Clair Gulling at [facility]. Living at home with mother. -clozapine 200mg  BID -benztropine 1mg  BID -hydroxyzine 25 mg 4 times daily -trazadone 50 mg daily  Pre-diabetes- Last A1c 6.25 Jan 2020. Referral placed to [facility] at that time. 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

## 2021-11-16 NOTE — Progress Notes (Signed)
Internal Medicine Clinic Attending  Case discussed with Dr. Masters  At the time of the visit.  We reviewed the resident's history and exam and pertinent patient test results.  I agree with the assessment, diagnosis, and plan of care documented in the resident's note.  

## 2022-02-24 DIAGNOSIS — F17210 Nicotine dependence, cigarettes, uncomplicated: Secondary | ICD-10-CM | POA: Diagnosis not present

## 2022-05-12 DIAGNOSIS — F17210 Nicotine dependence, cigarettes, uncomplicated: Secondary | ICD-10-CM | POA: Diagnosis not present

## 2022-08-24 ENCOUNTER — Ambulatory Visit (INDEPENDENT_AMBULATORY_CARE_PROVIDER_SITE_OTHER): Payer: 59 | Admitting: Student

## 2022-08-24 ENCOUNTER — Encounter: Payer: Self-pay | Admitting: Student

## 2022-08-24 VITALS — BP 111/79 | HR 111 | Temp 98.3°F | Wt 124.6 lb

## 2022-08-24 DIAGNOSIS — N3943 Post-void dribbling: Secondary | ICD-10-CM

## 2022-08-24 DIAGNOSIS — R7303 Prediabetes: Secondary | ICD-10-CM | POA: Diagnosis not present

## 2022-08-24 DIAGNOSIS — N401 Enlarged prostate with lower urinary tract symptoms: Secondary | ICD-10-CM | POA: Diagnosis not present

## 2022-08-24 DIAGNOSIS — K5900 Constipation, unspecified: Secondary | ICD-10-CM | POA: Diagnosis not present

## 2022-08-24 DIAGNOSIS — R634 Abnormal weight loss: Secondary | ICD-10-CM | POA: Diagnosis not present

## 2022-08-24 DIAGNOSIS — Z1211 Encounter for screening for malignant neoplasm of colon: Secondary | ICD-10-CM

## 2022-08-24 DIAGNOSIS — R000 Tachycardia, unspecified: Secondary | ICD-10-CM | POA: Diagnosis not present

## 2022-08-24 DIAGNOSIS — F209 Schizophrenia, unspecified: Secondary | ICD-10-CM

## 2022-08-24 LAB — POCT GLYCOSYLATED HEMOGLOBIN (HGB A1C): Hemoglobin A1C: 5.6 % (ref 4.0–5.6)

## 2022-08-24 LAB — GLUCOSE, CAPILLARY: Glucose-Capillary: 82 mg/dL (ref 70–99)

## 2022-08-24 MED ORDER — TAMSULOSIN HCL 0.4 MG PO CAPS: 0.4000 mg | ORAL_CAPSULE | Freq: Every day | ORAL | 3 refills | Status: AC

## 2022-08-24 NOTE — Assessment & Plan Note (Signed)
No LUTS today. -Refilled flomax

## 2022-08-24 NOTE — Assessment & Plan Note (Signed)
Patient noted to be tachycardic today to 111 initially. Manual pulse check on repeat 95. Reviewed past OV and patient's pulse in persistently 90-100. Suspect this is likely anticholinergic effect from chronic antipsychotic therapies. Will check TSH today as well -Monitor at next OV -F/U TSH level

## 2022-08-24 NOTE — Assessment & Plan Note (Addendum)
Patient presents to clinic with his mother, who is also his caretaker for known history of cognitive impairment and paranoid schizophrenia for evaluation of progressive weight loss over the past year. Blake Long reports that Blake Long has been in his usual state of health, however, she is concerned that he seems to be losing weight. She notes that patient likes drawing and that concentrates doing activities he likes for long periods of time and does not eat frequently. Since moving out of the group home in 2020, she has assumed care of Blake Long and is careful to have healthy food available for him at all times and does not buy as much candy anymore. He particularly enjoys peanut butter and jelly sandwiches. Pistol reports that when his mother is not around, he at times snacks, but it is often not too much.   Because her own history of GI symptoms that lead her to endometrial cancer diagnosis, Blake Long wants to make sure Blake Long is evaluated for alternative causes of lack of appetite and weight loss other than the psychiatric meds Blake Long takes for the management of paranoid schizophrenia. He has been stable on this current regimen and it would be best to not change it. This regimen includes Depakote, Clozapine, Buspar, trazodone.  On extensive reviews of systems, patient is pan-negative. He has a chronic history of constipation that is treated with Miralax and Sennokot. In the past, stools have been hard and with bright blood per rectum but non recently.   Physical exam without lymphadenopathy or overt pathological findings. I reviewed patient's [facility] office notes from 2022 and I see that he was ~135lb at the time, and he is 124 lbs now.  Suspect patient lack of appetite is secondary to polypharmacy. Reviewed patient's cbc with differential from [facility] in person (see media tab) with mild anemia and mild thrombocytopenia. There was no comment on cell morphology. Will further evaluate today with laboratory studies  below; if abnormalities, will further evaluate. Patient is also due for age-appropriate screening colonoscopy for which I will refer today. In the meantime, encouraged patient and his mother to add Ensure and protein-rich shakes into diet. Will continue to monitor weight trends at next OV. - F/u CMP, Hep C, HIV, RPR, and TSH - Referral to gastroenterology for screening colonoscopy -Monitor weight trends

## 2022-08-24 NOTE — Assessment & Plan Note (Signed)
Improved with daily Miralax and Sennokot

## 2022-08-24 NOTE — Patient Instructions (Addendum)
Thank you, [name] for allowing [place] to provide your care today. Today we discussed your weight loss    Referrals: -Gastroenterology for screening colonoscopy  I have ordered the following labs for you:   Lab Orders         TSH         CMP14 + Anion Gap         HIV antibody (with reflex)         Hepatitis C Ab reflex to Quant PCR         RPR      I will call if any are abnormal. All of your labs can be accessed through "My Chart".   My Chart Access: [email or web address]  We will plan to schedule your follow up appointment depending on your lab results    We look forward to seeing you next time. Please call our clinic at [phone] if you have any questions or concerns. The best time to call is Monday-Friday from 9am-4pm, but there is someone available 24/7. If after hours or the weekend, call the main hospital number and ask for the Internal Medicine Resident On-Call. If you need medication refills, please notify your pharmacy one week in advance and they will send [place] a request.   Thank you for letting us take part in your care. Wishing you the best!  Blake Crocker, MD 08/24/2022, 4:43 PM [facility] Resident, PGY-1

## 2022-08-24 NOTE — Progress Notes (Signed)
Subjective:  CC: Decreased appetite and weight loss  HPI:  Blake Long. is a 47 y.o. male with a past medical history stated below and presents today for evaluation of progressive weight loss and decreased appetite. He was accompanied by his caretaker and mother, [name], who was able to assist during the information gathering part of this encounter. Please see problem based assessment and plan for additional details.  Past Medical History:  Diagnosis Date   Anxiety    Cognitive impairment    GERD (gastroesophageal reflux disease)    Hyperglycemia    Hypertension    Overactive bladder    Paranoid disorder (HCC)    Schizophrenia (H[facility])    Seizure disorder (H[facility])     Current Outpatient Medications on File Prior to Visit  Medication Sig Dispense Refill   benztropine (COGENTIN) 1 MG tablet Take 2 mg by mouth at bedtime.      busPIRone (BUSPAR) 7.5 MG tablet Take 7.5 mg by mouth 3 (three) times daily.     cloZAPine (CLOZARIL) 200 MG tablet Take 1 tablet (200 mg total) by mouth 2 (two) times daily. Patient to take 200 mg in morning and 200 mg at bed time 60 tablet 0   diclofenac Sodium (VOLTAREN) 1 % GEL Apply 2 g topically 4 (four) times daily. 2 g 3   divalproex (DEPAKOTE ER) 500 MG 24 hr tablet Take 1,000 mg by mouth at bedtime.      docusate sodium (COLACE) 100 MG capsule Take 100 mg by mouth daily as needed for mild constipation.     hydrOXYzine (ATARAX/VISTARIL) 25 MG tablet Take 25 mg by mouth 4 (four) times daily as needed for anxiety.     ibuprofen (ADVIL) 600 MG tablet Take 1 tablet (600 mg total) by mouth every 6 (six) hours as needed. 30 tablet 0   loratadine (CLARITIN) 10 MG tablet TAKE 1 TABLET BY MOUTH DAILY AS NEEDED FOR ALLERGIES 30 tablet 11   methocarbamol (ROBAXIN) 500 MG tablet Take 1 tablet (500 mg total) by mouth at bedtime as needed for muscle spasms. 20 tablet 0   omeprazole (PRILOSEC) 20 MG capsule Take 2 capsules (40 mg total) by mouth  daily. (Patient not taking: Reported on 02/08/2019) 30 capsule 0   Polyethylene Glycol 3350 (PEG 3350) 17 g PACK MIX AND DRINK 1 PACKET BY MOUTH DAILY WITH WATER OR JUICE as needed 14 each 0   traZODone (DESYREL) 50 MG tablet      No current facility-administered medications on file prior to visit.    Family History  Problem Relation Age of Onset   Cancer Mother     Social History   Socioeconomic History   Marital status: Single    Spouse name: Not on file   Number of children: Not on file   Years of education: Not on file   Highest education level: Not on file  Occupational History   Not on file  Tobacco Use   Smoking status: Never   Smokeless tobacco: Not on file  Vaping Use   Vaping status: Never Used  Substance and Sexual Activity   Alcohol use: No   Drug use: Never   Sexual activity: Never  Other Topics Concern   Not on file  Social History Narrative   Not on file   Social Determinants of Health   Financial Resource Strain: Not on file  Food Insecurity: Not on file  Transportation Needs: Not on file  Physical Activity: Not  on file  Stress: Not on file  Social Connections: Not on file  Intimate Partner Violence: Not on file    Review of Systems: Constitutional:  Negative for chills, fever and malaise/fatigue.  Eyes:  Negative for blurred vision.  Respiratory:  Negative for cough, sputum production, shortness of breath and wheezing.   Cardiovascular:  Negative for chest pain, palpitations and leg swelling.  Gastrointestinal:  Negative for abdominal pain, blood in stool, constipation, diarrhea, heartburn, melena, nausea and vomiting.  Genitourinary:  Negative for dysuria, flank pain, frequency, hematuria and urgency.  Musculoskeletal:  Negative for back pain, joint pain, myalgias and neck pain.  Skin:  Negative for itching and rash.  Neurological:  Negative for dizziness, tremors, weakness and headaches.  Endo/Heme/Allergies:  Negative for polydipsia. Does  not bruise/bleed easily.  Psychiatric/Behavioral:  Negative for suicidal ideas. The patient is not nervous/anxious and does not have insomnia.     Objective:   Vitals:   08/24/22 1513  BP: 111/79  Pulse: (!) 111  Temp: 98.3 F (36.8 C)  TempSrc: Oral  SpO2: 100%  Weight: 124 lb 9.6 oz (56.5 kg)    Physical Exam: Constitutional: well-appearing man sitting in exam chair, in no acute distress HENT: mucous membranes moist Eyes: conjunctiva non-erythematous Neck: supple, no lymphadenopathy Cardiovascular: regular rate and rhythm, no m/r/g Pulmonary/Chest: normal work of breathing on room air, lungs clear to auscultation bilaterally Abdominal: soft, non-tender, non-distended MSK: normal bulk and tone Neurological: alert & oriented x 3, moving all extremities, normal gait Skin: warm and dry Psych: Pleasant mood and affect    Assessment & Plan:   Weight loss Patient presents to clinic with his mother, who is also his caretaker for known history of cognitive impairment and paranoid schizophrenia for evaluation of progressive weight loss over the past year. [name] reports that [name] has been in his usual state of health, however, she is concerned that he seems to be losing weight. She notes that patient likes drawing and that concentrates doing activities he likes for long periods of time and does not eat frequently. Since moving out of the group home in 2020, she has assumed care of [name] and is careful to have healthy food available for him at all times and does not buy as much candy anymore. He particularly enjoys peanut butter and jelly sandwiches. [name] reports that when his mother is not around, he at times snacks, but it is often not too much.   Because her own history of GI symptoms that lead her to endometrial cancer diagnosis, Ms. Mcdonnell wants to make sure [name] is evaluated for alternative causes of lack of appetite and weight loss other than the psychiatric meds [name]  takes for the management of paranoid schizophrenia. He has been stable on this current regimen and it would be best to not change it. This regimen includes Depakote, Clozapine, Buspar, trazodone.  On extensive reviews of systems, patient is pan-negative. He has a chronic history of constipation that is treated with Miralax and Sennokot. In the past, stools have been hard and with bright blood per rectum but non recently.   Physical exam without lymphadenopathy or overt pathological findings. I reviewed patient's [facility] office notes from 2022 and I see that he was ~135lb at the time, and he is 124 lbs now.  Suspect patient lack of appetite is secondary to polypharmacy. Reviewed patient's cbc with differential from [facility] in person (see media tab) with mild anemia and mild thrombocytopenia. There was no comment on  cell morphology. Will further evaluate today with laboratory studies below; if abnormalities, will further evaluate. Patient is also due for age-appropriate screening colonoscopy for which I will refer today. In the meantime, encouraged patient and his mother to add Ensure and protein-rich shakes into diet. Will continue to monitor weight trends at next OV. - F/u CMP, Hep C, HIV, RPR, and TSH - Referral to gastroenterology for screening colonoscopy -Monitor weight trends    Prediabetes -A1c today resulted with improvement -Will recheck again in 6-12 months  Constipation Improved with daily Miralax and Sennokot  Schizophrenia (HCC) Stable. Managed at [facility] with close follow up  BPH (benign prostatic hyperplasia) No LUTS today. -Refilled flomax  Tachycardia Patient noted to be tachycardic today to 111 initially. Manual pulse check on repeat 95. Reviewed past OV and patient's pulse in persistently 90-100. Suspect this is likely anticholinergic effect from chronic antipsychotic therapies. Will check TSH today as well -Monitor at next OV -F/U TSH level    Return Will schedule  follow up depending on laboratory results.  Patient discussed with Dr. Alfonse Alpers, MD [facility] Internal Medicine Program - PGY-2 08/24/2022, 9:36 PM

## 2022-08-24 NOTE — Assessment & Plan Note (Signed)
-  A1c today resulted with improvement -Will recheck again in 6-12 months

## 2022-08-24 NOTE — Assessment & Plan Note (Signed)
Stable. Managed at [facility] with close follow up

## 2022-08-25 NOTE — Progress Notes (Signed)
Spoke with Ms. Blake Long - Mr. Blake Long' mother and caretaker. All laboratory work up thus far normal. Proposed increasing calorie rich foods in diet and reassessing in 2-3 months. Patient should undergo colonoscopy screening. Ms. Blake Long is in agreement with this plan.

## 2022-08-29 NOTE — Progress Notes (Signed)
Internal Medicine Clinic Attending  Case discussed with the resident at the time of the visit.  We reviewed the resident's history and exam and pertinent patient test results.  I agree with the assessment, diagnosis, and plan of care documented in the resident's note.  Debe Coder, MD

## 2022-10-13 ENCOUNTER — Ambulatory Visit: Payer: 59

## 2023-05-04 ENCOUNTER — Encounter: Admitting: Student
